# Patient Record
Sex: Male | Born: 1970 | Race: White | Hispanic: No | Marital: Married | Smoking: Never smoker
Health system: Southern US, Community
[De-identification: ages and names within clinical notes are randomized; demographics above are authoritative.]

## PROBLEM LIST (undated history)

## (undated) DIAGNOSIS — R7301 Impaired fasting glucose: Secondary | ICD-10-CM

## (undated) DIAGNOSIS — S43109A Unspecified dislocation of unspecified acromioclavicular joint, initial encounter: Secondary | ICD-10-CM

## (undated) DIAGNOSIS — E782 Mixed hyperlipidemia: Secondary | ICD-10-CM

## (undated) HISTORY — DX: Unspecified dislocation of unspecified acromioclavicular joint, initial encounter: S43.109A

## (undated) HISTORY — DX: Impaired fasting glucose: R73.01

## (undated) HISTORY — DX: Mixed hyperlipidemia: E78.2

---

## 2013-01-08 ENCOUNTER — Ambulatory Visit (INDEPENDENT_AMBULATORY_CARE_PROVIDER_SITE_OTHER): Payer: 59 | Admitting: Family Medicine

## 2013-01-08 ENCOUNTER — Encounter: Payer: Self-pay | Admitting: Family Medicine

## 2013-01-08 VITALS — BP 126/86 | HR 68 | Ht 72.0 in | Wt 192.0 lb

## 2013-01-08 DIAGNOSIS — Z23 Encounter for immunization: Secondary | ICD-10-CM

## 2013-01-08 DIAGNOSIS — E8881 Metabolic syndrome: Secondary | ICD-10-CM | POA: Insufficient documentation

## 2013-01-08 DIAGNOSIS — R7301 Impaired fasting glucose: Secondary | ICD-10-CM | POA: Insufficient documentation

## 2013-01-08 DIAGNOSIS — E782 Mixed hyperlipidemia: Secondary | ICD-10-CM | POA: Insufficient documentation

## 2013-01-08 DIAGNOSIS — Z0000 Encounter for general adult medical examination without abnormal findings: Secondary | ICD-10-CM

## 2013-01-08 LAB — POCT URINALYSIS DIPSTICK
Blood, UA: NEGATIVE
Nitrite, UA: NEGATIVE
Protein, UA: NEGATIVE
Spec Grav, UA: 1.01
Urobilinogen, UA: NEGATIVE
pH, UA: 7

## 2013-01-08 NOTE — Progress Notes (Signed)
Chief Complaint  Patient presents with  . Annual Exam    new patient nonfasting exam. No major concerns. Patient declines flu vaccine.    John Frazier is a 42 y.o. male who presents for a complete physical.  He has the following concerns:  He brings in copies of labs done through work: Labs 12/10/12: fglu 100; TG 286, Tchol 208, chol/HDL 72, HDL 29, LDL 122 (07/2011--fglu 106, TG 201, chol/hDL 7.8, HDL 29, LDL 156)  He cut back on juices about 2 months ago; stopped adding salt to his food about 3 months ago (but still has foods that contain a lot of sodium, ie chinese food).    He has no other complaints or concerns today.  Health Maintenance: There is no immunization history on file for this patient. Last tetanus was about 8 years ago, unsure if Td or Tdap, prior to his last job Declines flu shots Last colonoscopy: never Last PSA: never Dentist: about 2 years ago, previously had gone once yearly Ophtho: yearly Exercise:  Some exercise daily, more on weekends.  History reviewed. No pertinent past medical history.  History reviewed. No pertinent past surgical history.  History   Social History  . Marital Status: Single    Spouse Name: N/A    Number of Children: N/A  . Years of Education: N/A   Occupational History  . Account Manager [employer]   Social History Main Topics  . Smoking status: Never Smoker   . Smokeless tobacco: Never Used  . Alcohol Use: Yes     Comment: 0-2 drinks per week.  . Drug Use: No  . Sexual Activity: Yes    Partners: Female   Other Topics Concern  . Not on file   Social History Narrative   Engaged; lives with fiance, 3 cats.  No tobacco exposure.    Family History  Problem Relation Age of Onset  . Hypertension Mother   . Stroke Mother 68    shortly after CABG; multiple strokes since then  . Heart disease Mother 73    CABG x 2  . Diabetes Mother   . Hyperlipidemia Mother   . Seizures Brother   . Cancer Maternal Uncle    lung, smoker  . Cancer Maternal Grandmother     type not known  . Prostate cancer Neg Hx   . Cancer Maternal Uncle     ?type; abdominal  . Alcohol abuse Maternal Uncle     No current outpatient prescriptions on file.  No Known Allergies   ROS:  The patient denies anorexia, fever, weight changes, headaches,  vision loss, decreased hearing, ear pain, hoarseness, chest pain, palpitations, dizziness, syncope, dyspnea on exertion, cough, swelling, nausea, vomiting, diarrhea, constipation, abdominal pain, melena, hematochezia, indigestion/heartburn, hematuria, incontinence, erectile dysfunction, nocturia, weakened urine stream, dysuria, genital lesions, joint pains, numbness, tingling, weakness, tremor, suspicious skin lesions, depression, anxiety, abnormal bleeding/bruising, or enlarged lymph nodes  PHYSICAL EXAM:  BP 130/100  Pulse 68  Ht 6' (1.829 m)  Wt 192 lb (87.091 kg)  BMI 26.03 kg/m2 126/86 on repeat by MD General Appearance:    Alert, cooperative, no distress, appears stated age  Head:    Normocephalic, without obvious abnormality, atraumatic  Eyes:    PERRL, conjunctiva/corneas clear, EOM's intact, fundi    benign  Ears:    Normal TM's and external ear canals  Nose:   Nares normal, mucosa normal, no drainage or sinus   tenderness  Throat:   Lips, mucosa, and tongue normal  Neck:   Supple, no lymphadenopathy;  thyroid:  no   enlargement/tenderness/nodules; no carotid   bruit or JVD  Back:    Spine nontender, no curvature, ROM normal, no CVA     tenderness  Lungs:     Clear to auscultation bilaterally without wheezes, rales or     ronchi; respirations unlabored  Chest Wall:    No tenderness or deformity   Heart:    Regular rate and rhythm, S1 and S2 normal, no murmur, rub   or gallop  Breast Exam:    No chest wall tenderness, masses or gynecomastia  Abdomen:     Soft, non-tender, nondistended, normoactive bowel sounds,    no masses, no hepatosplenomegaly  Genitalia:     Normal male external genitalia without lesions.  Testicles without masses.  No inguinal hernias.  Rectal:    Exam declined/refused by patient.  Extremities:   No clubbing, cyanosis or edema  Pulses:   2+ and symmetric all extremities  Skin:   Skin color, texture, turgor normal, no rashes or lesions.  Benign moles noted on chest, back  Lymph nodes:   Cervical, supraclavicular, and axillary nodes normal  Neurologic:   CNII-XII intact, normal strength, sensation and gait; reflexes 2+ and symmetric throughout          Psych:   Normal mood, affect, hygiene and grooming.    ASSESSMENT/PLAN:  Routine general medical examination at a health care facility - Plan: Visual acuity screening, POCT Urinalysis Dipstick  Metabolic syndrome  Mixed dyslipidemia  Impaired fasting glucose  Need for Tdap vaccination - Plan: Tdap vaccine greater than or equal to 7yo IM  42 yo patient with mild IFG, elevated TG, very low HDL, normal LDL but high chol/HDL ratio, and mildly elevated diastolic BP; otherwise healthy.   Counseled extensively re: diet (low sodium, lowfat, low cholesterol), the need for daily exercise; recommended fish oil.  Re-check in 6 months (pt states he won't be able to return sooner than February).  Discussed low HDL--increasing exercise may help, but a lot is hereditary.  Even though LDL is okay, ratio is high; needs to get total cholesterol down.  Recommended at least 30 minutes of aerobic activity at least 5 days/week; proper sunscreen use reviewed; healthy diet and alcohol recommendations (less than or equal to 2 drinks/day) reviewed; regular seatbelt use; changing batteries in smoke detectors. Self-testicular exams. Immunization recommendations discussed--TdaP given; refused flu shot.   F/u 6 months with fasting labs prior

## 2013-01-08 NOTE — Patient Instructions (Addendum)
HEALTH MAINTENANCE RECOMMENDATIONS:  It is recommended that you get at least 30 minutes of aerobic exercise at least 5 days/week (for weight loss, you may need as much as 60-90 minutes). This can be any activity that gets your heart rate up. This can be divided in 10-15 minute intervals if needed, but try and build up your endurance at least once a week.  Weight bearing exercise is also recommended twice weekly.  Eat a healthy diet with lots of vegetables, fruits and fiber.  "Colorful" foods have a lot of vitamins (ie green vegetables, tomatoes, red peppers, etc).  Limit sweet tea, regular sodas and alcoholic beverages, all of which has a lot of calories and sugar.  Up to 2 alcoholic drinks daily may be beneficial for men (unless trying to lose weight, watch sugars).  Drink a lot of water.  Sunscreen of at least SPF 30 should be used on all sun-exposed parts of the skin when outside between the hours of 10 am and 4 pm (not just when at beach or pool, but even with exercise, golf, tennis, and yard work!)  Use a sunscreen that says "broad spectrum" so it covers both UVA and UVB rays, and make sure to reapply every 1-2 hours.  Remember to change the batteries in your smoke detectors when changing your clock times in the spring and fall.  Use your seat belt every time you are in a car, and please drive safely and not be distracted with cell phones and texting while driving.  Start omega-3 fish oil 3000-4000 mg daily to help with triglycerides.  Sodium-Controlled Diet Sodium is a mineral. It is found in many foods. Sodium may be found naturally or added during the making of a food. The most common form of sodium is salt, which is made up of sodium and chloride. Reducing your sodium intake involves changing your eating habits. The following guidelines will help you reduce the sodium in your diet:  Stop using the salt shaker.  Use salt sparingly in cooking and baking.  Substitute with sodium-free  seasonings and spices.  Do not use a salt substitute (potassium chloride) without your caregiver's permission.  Include a variety of fresh, unprocessed foods in your diet.  Limit the use of processed and convenience foods that are high in sodium. USE THE FOLLOWING FOODS SPARINGLY: Breads/Starches  Commercial bread stuffing, commercial pancake or waffle mixes, coating mixes. Waffles. Croutons. Prepared (boxed or frozen) potato, rice, or noodle mixes that contain salt or sodium. Salted [place] fries or hash browns. Salted popcorn, breads, crackers, chips, or snack foods. Vegetables  Vegetables canned with salt or prepared in cream, butter, or cheese sauces. Sauerkraut. Tomato or vegetable juices canned with salt.  Fresh vegetables are allowed if rinsed thoroughly. Fruit  Fruit is okay to eat. Meat and Meat Substitutes  Salted or smoked meats, such as bacon or Canadian bacon, chipped or corned beef, hot dogs, salt pork, luncheon meats, pastrami, ham, or sausage. Canned or smoked fish, poultry, or meat. Processed cheese or cheese spreads, blue or Roquefort cheese. Battered or frozen fish products. Prepared spaghetti sauce. Baked beans. Reuben sandwiches. Salted nuts. Caviar. Milk  Limit buttermilk to 1 cup per week. Soups and Combination Foods  Bouillon cubes, canned or dried soups, broth, consomm. Convenience (frozen or packaged) dinners with more than 600 mg sodium. Pot pies, pizza, Asian food, fast food cheeseburgers, and specialty sandwiches. Desserts and Sweets  Regular (salted) desserts, pie, commercial fruit snack pies, commercial snack cakes, canned puddings.  Eat desserts and sweets in moderation. Fats and Oils  Gravy mixes or canned gravy. No more than 1 to 2 tbs of salad dressing. Chip dips.  Eat fats and oils in moderation. Beverages  See those listed under the vegetables and milk groups. Condiments  Ketchup, mustard, meat sauces, salsa, regular (salted) and lite  soy sauce or mustard. Dill pickles, olives, meat tenderizer. Prepared horseradish or pickle relish. Dutch-processed cocoa. Baking powder or baking soda used medicinally. Worcestershire sauce. "Light" salt. Salt substitute, unless approved by your caregiver. Document Released: 10/20/2001 Document Revised: 07/23/2011 Document Reviewed: 05/23/2009 [facility] Patient Information 2014 [place], [place].  Fat and Cholesterol Control Diet Cholesterol levels in your body are determined significantly by your diet. Cholesterol levels may also be related to heart disease. The following material helps to explain this relationship and discusses what you can do to help keep your heart healthy. Not all cholesterol is bad. Low-density lipoprotein (LDL) cholesterol is the "bad" cholesterol. It may cause fatty deposits to build up inside your arteries. High-density lipoprotein (HDL) cholesterol is "good." It helps to remove the "bad" LDL cholesterol from your blood. Cholesterol is a very important risk factor for heart disease. Other risk factors are high blood pressure, smoking, stress, heredity, and weight. The heart muscle gets its supply of blood through the coronary arteries. If your LDL cholesterol is high and your HDL cholesterol is low, you are at risk for having fatty deposits build up in your coronary arteries. This leaves less room through which blood can flow. Without sufficient blood and oxygen, the heart muscle cannot function properly and you may feel chest pains (angina pectoris). When a coronary artery closes up entirely, a part of the heart muscle may die causing a heart attack (myocardial infarction). CHECKING CHOLESTEROL When your caregiver sends your blood to a lab to be examined for cholesterol, a complete lipid (fat) profile may be done. With this test, the total amount of cholesterol and levels of LDL and HDL are determined. Triglycerides are a type of fat that circulates in the blood. They can also be  used to determine heart disease risk. The list below describes what the numbers should be: Test: Total Cholesterol.  Less than 200 mg/dl. Test: LDL "bad cholesterol."  Less than 100 mg/dl.  Less than 70 mg/dl if you are at very high risk of a heart attack or sudden cardiac death. Test: HDL "good cholesterol."  Greater than 50 mg/dl for women.  Greater than 40 mg/dl for men. Test: Triglycerides.  Less than 150 mg/dl. CONTROLLING CHOLESTEROL WITH DIET Although exercise and lifestyle factors are important, your diet is key. That is because certain foods are known to raise cholesterol and others to lower it. The goal is to balance foods for their effect on cholesterol and more importantly, to replace saturated and trans fat with other types of fat, such as monounsaturated fat, polyunsaturated fat, and omega-3 fatty acids. On average, a person should consume no more than 15 to 17 g of saturated fat daily. Saturated and trans fats are considered "bad" fats, and they will raise LDL cholesterol. Saturated fats are primarily found in animal products such as meats, butter, and cream. However, that does not mean you need to give up all your favorite foods. Today, there are good tasting, low-fat, low-cholesterol substitutes for most of the things you like to eat. Choose low-fat or nonfat alternatives. Choose round or loin cuts of red meat. These types of cuts are lowest in fat and cholesterol. Chicken (  without the skin), fish, veal, and ground [place] breast are great choices. Eliminate fatty meats, such as hot dogs and salami. Even shellfish have little or no saturated fat. Have a 3 oz (85 g) portion when you eat lean meat, poultry, or fish. Trans fats are also called "partially hydrogenated oils." They are oils that have been scientifically manipulated so that they are solid at room temperature resulting in a longer shelf life and improved taste and texture of foods in which they are added. Trans fats are  found in stick margarine, some tub margarines, cookies, crackers, and baked goods.  When baking and cooking, oils are a great substitute for butter. The monounsaturated oils are especially beneficial since it is believed they lower LDL and raise HDL. The oils you should avoid entirely are saturated tropical oils, such as coconut and palm.  Remember to eat a lot from food groups that are naturally free of saturated and trans fat, including fish, fruit, vegetables, beans, grains (barley, rice, couscous, bulgur wheat), and pasta (without cream sauces).  IDENTIFYING FOODS THAT LOWER CHOLESTEROL  Soluble fiber may lower your cholesterol. This type of fiber is found in fruits such as apples, vegetables such as broccoli, potatoes, and carrots, legumes such as beans, peas, and lentils, and grains such as barley. Foods fortified with plant sterols (phytosterol) may also lower cholesterol. You should eat at least 2 g per day of these foods for a cholesterol lowering effect.  Read package labels to identify low-saturated fats, trans fat free, and low-fat foods at the supermarket. Select cheeses that have only 2 to 3 g saturated fat per ounce. Use a heart-healthy tub margarine that is free of trans fats or partially hydrogenated oil. When buying baked goods (cookies, crackers), avoid partially hydrogenated oils. Breads and muffins should be made from whole grains (whole-wheat or whole oat flour, instead of "flour" or "enriched flour"). Buy non-creamy canned soups with reduced salt and no added fats.  FOOD PREPARATION TECHNIQUES  Never deep-fry. If you must fry, either stir-fry, which uses very little fat, or use non-stick cooking sprays. When possible, broil, bake, or roast meats, and steam vegetables. Instead of putting butter or margarine on vegetables, use lemon and herbs, applesauce, and cinnamon (for squash and sweet potatoes), nonfat yogurt, salsa, and low-fat dressings for salads.  LOW-SATURATED FAT / LOW-FAT  FOOD SUBSTITUTES Meats / Saturated Fat (g)  Avoid: Steak, marbled (3 oz/85 g) / 11 g  Choose: Steak, lean (3 oz/85 g) / 4 g  Avoid: Hamburger (3 oz/85 g) / 7 g  Choose: Hamburger, lean (3 oz/85 g) / 5 g  Avoid: Ham (3 oz/85 g) / 6 g  Choose: Ham, lean cut (3 oz/85 g) / 2.4 g  Avoid: Chicken, with skin, dark meat (3 oz/85 g) / 4 g  Choose: Chicken, skin removed, dark meat (3 oz/85 g) / 2 g  Avoid: Chicken, with skin, light meat (3 oz/85 g) / 2.5 g  Choose: Chicken, skin removed, light meat (3 oz/85 g) / 1 g Dairy / Saturated Fat (g)  Avoid: Whole milk (1 cup) / 5 g  Choose: Low-fat milk, 2% (1 cup) / 3 g  Choose: Low-fat milk, 1% (1 cup) / 1.5 g  Choose: Skim milk (1 cup) / 0.3 g  Avoid: Hard cheese (1 oz/28 g) / 6 g  Choose: Skim milk cheese (1 oz/28 g) / 2 to 3 g  Avoid: Cottage cheese, 4% fat (1 cup) / 6.5 g  Choose: Low-fat  cottage cheese, 1% fat (1 cup) / 1.5 g  Avoid: Ice cream (1 cup) / 9 g  Choose: Sherbet (1 cup) / 2.5 g  Choose: Nonfat frozen yogurt (1 cup) / 0.3 g  Choose: Frozen fruit bar / trace  Avoid: Whipped cream (1 tbs) / 3.5 g  Choose: Nondairy whipped topping (1 tbs) / 1 g Condiments / Saturated Fat (g)  Avoid: Mayonnaise (1 tbs) / 2 g  Choose: Low-fat mayonnaise (1 tbs) / 1 g  Avoid: Butter (1 tbs) / 7 g  Choose: Extra light margarine (1 tbs) / 1 g  Avoid: Coconut oil (1 tbs) / 11.8 g  Choose: Olive oil (1 tbs) / 1.8 g  Choose: Corn oil (1 tbs) / 1.7 g  Choose: Safflower oil (1 tbs) / 1.2 g  Choose: Sunflower oil (1 tbs) / 1.4 g  Choose: Soybean oil (1 tbs) / 2.4 g  Choose: Canola oil (1 tbs) / 1 g Document Released: 04/30/2005 Document Revised: 07/23/2011 Document Reviewed: 10/19/2010 [employer] Patient Information 2014 [place], [place].  Metabolic Syndrome, Adult Metabolic syndrome descibes a group of risk factors for heart disease and diabetes. This syndrome has other names including Insulin Resistance Syndrome. The  more risk factors you have, the higher your risk of having a heart attack, stroke, or developing diabetes. These risk factors include:  High blood sugar.  High blood triglyceride (a fat found in the blood) level.  High blood pressure.  Abdominal obesity (your extra weight is around your waist instead of your hips).  Low levels of high-density lipoprotein, HDL (good blood cholesterol). If you have any three of these risk factors, you have metabolic syndrome. If you have even one of these factors, you should make lifestyle changes to improve your health in order to prevent serious health diseases.  In people with metabolic syndrome, the cells do not respond properly to insulin. This can lead to high levels of glucose in the blood, which can interfere with normal body processes. Eventually, this can cause high blood pressure and higher fat levels in the blood, and inflammation of your blood vessels. The result can be heart disease and stroke.  CAUSES   Eating a diet rich in calories and saturated fat.  Too little physical activity.  Being overweight. Other underlying causes are:  Family history (genetics).  Ethnicity (South Asians are at a higher risk).  Older age (your chances of developing metabolic syndrome are higher as you grow older).  Insulin resistance. SYMPTOMS  By itself, metabolic syndrome has no symptoms. However, you might have symptoms of diabetes (high blood sugar) or high blood pressure, such as:  Increased thirst, urination, and tiredness.  Dizzy spells.  Dull headaches that are unusual for you.  Blurred vision.  Nosebleeds. DIAGNOSIS  Your caregiver may make a diagnosis of metabolic syndrome if you have at least three of these factors:  If you are overweight mostly around the waist. This means a waistline greater than 40" in men and more than 35" in women. The waistline limits are 31 to 35 inches for women and 37 to 39 inches for men. In those who have  certain genetic risk factors, such as having a family history of diabetes or being of Asian descent.  If you have a blood pressure of 130/85 mm Hg or more, or if you are being treated for high blood pressure.  If your blood triglyceride level is 150 mg/dL or more, or you are being treated for high levels of triglyceride.  If the level of HDL in your blood is below 40 mg/dL in men, less than 50 mg/dL in women, or you are receiving treatment for low levels of HDL.  If the level of sugar in your blood is high with fasting blood sugar level of 110 mg/dL or more, or you are under treatment for diabetes. TREATMENT  Your caregiver may have you make lifestyle changes, which may include:  Exercise.  Losing weight.  Maintaining a healthy diet.  Quitting smoking. The lifestyle changes listed above are key in reducing your risk for heart disease and stroke. Medicines may also be prescribed to help your body respond to insulin better and to reduce your blood pressure and blood fat levels. Aspirin may be recommended to reduce risks of heart disease or stroke.  HOME CARE INSTRUCTIONS   Exercise.  Measure your waist at regular intervals just above the hipbones after you have breathed out.  Maintain a healthy diet.  Eat fruits, such as apples, oranges, and pears.  Eat vegetables.  Eat legumes, such as kidney beans, peas, and lentils.  Eat food rich in soluble fiber, such as whole grain cereal, oatmeal, and oat bran.  Use olive or safflower oils and avoid saturated fats.  Eat nuts.  Limit the amount of salt you eat or add to food.  Limit the amount of alcohol you drink.  Include fish in your diet, if possible.  Stop smoking if you are a smoker.  Maintain regular follow-up appointments.  Follow your caregiver's advice. SEEK MEDICAL CARE IF:   You feel very tired or fatigued.  You develop excessive thirst.  You pass large quantities of urine.  You are putting on weight around  your waist rather than losing weight.  You develop headaches over and over again.  You have off-and-on dizzy spells. SEEK IMMEDIATE MEDICAL CARE IF:   You develop nosebleeds.  You develop sudden blurred vision.  You develop sudden dizzy spells.  You develop chest pains, trouble breathing, or feel an abnormal or irregular heart beat.  You have a fainting episode.  You develop any sudden trouble speaking and/or swallowing.  You develop sudden weakness in one arm and/or one leg. MAKE SURE YOU:   Understand these instructions.  Will watch your condition.  Will get help right away if you are not doing well or get worse. Document Released: 08/07/2007 Document Revised: 07/23/2011 Document Reviewed: 08/07/2007 [facility] Patient Information 2014 [place], [place].

## 2014-01-13 ENCOUNTER — Encounter: Payer: Self-pay | Admitting: Family Medicine

## 2014-10-07 ENCOUNTER — Telehealth: Payer: Self-pay | Admitting: Internal Medicine

## 2014-10-07 NOTE — Telephone Encounter (Signed)
Pt has not been seen since 2014 with you but called today needing a referral to chiropactor, has he seen Alvan DamePaul Spell yesterday for lower back pain. Pt has [employer]Marland Kitchen. Pt was told you were not here today but will be here tomorrow and if you approved this(since he hasn't been in), it will be done tomorrow by her nurse Suzette BattiestVeronica, he said ok.   [employer]- [id number] Provider-Paul Spell @ [facility] on [address] Dx- Lower back pain

## 2014-10-07 NOTE — Telephone Encounter (Signed)
Tried to call pt but phone number is not correct.

## 2014-10-07 NOTE — Telephone Encounter (Signed)
He has been seen once in the office almost 2 years ago, (and was told to f/u in 6 months).  If his insurance requires a referral from PCP, then they shouldn't have seen him without checking with our office first. I cannot retroactively approve this   (I only retroactively approve when pts have a change in insurance, and are going for routine f/u visits with other providers that have been giving routine care--ie cardiologist, eye doctor, etc, but due to change in insurance, needs a retroactive approval, and see me on a routine basis).

## 2015-11-14 ENCOUNTER — Encounter: Payer: Self-pay | Admitting: Family Medicine

## 2015-11-14 ENCOUNTER — Ambulatory Visit (INDEPENDENT_AMBULATORY_CARE_PROVIDER_SITE_OTHER): Payer: 59 | Admitting: Family Medicine

## 2015-11-14 VITALS — BP 110/80 | HR 88 | Ht 72.5 in | Wt 200.0 lb

## 2015-11-14 DIAGNOSIS — E559 Vitamin D deficiency, unspecified: Secondary | ICD-10-CM

## 2015-11-14 DIAGNOSIS — K5900 Constipation, unspecified: Secondary | ICD-10-CM | POA: Diagnosis not present

## 2015-11-14 DIAGNOSIS — R5383 Other fatigue: Secondary | ICD-10-CM

## 2015-11-14 DIAGNOSIS — E8881 Metabolic syndrome: Secondary | ICD-10-CM

## 2015-11-14 DIAGNOSIS — R7301 Impaired fasting glucose: Secondary | ICD-10-CM | POA: Diagnosis not present

## 2015-11-14 DIAGNOSIS — Z0000 Encounter for general adult medical examination without abnormal findings: Secondary | ICD-10-CM

## 2015-11-14 DIAGNOSIS — E782 Mixed hyperlipidemia: Secondary | ICD-10-CM | POA: Diagnosis not present

## 2015-11-14 LAB — CBC WITH DIFFERENTIAL/PLATELET
BASOS ABS: 0 {cells}/uL (ref 0–200)
BASOS PCT: 0 %
EOS PCT: 1 %
Eosinophils Absolute: 66 cells/uL (ref 15–500)
HCT: 44.7 % (ref 38.5–50.0)
Hemoglobin: 15.7 g/dL (ref 13.2–17.1)
LYMPHS ABS: 1980 {cells}/uL (ref 850–3900)
Lymphocytes Relative: 30 %
MCH: 30.6 pg (ref 27.0–33.0)
MCHC: 35.1 g/dL (ref 32.0–36.0)
MCV: 87.1 fL (ref 80.0–100.0)
MONOS PCT: 6 %
MPV: 10.1 fL (ref 7.5–12.5)
Monocytes Absolute: 396 cells/uL (ref 200–950)
NEUTROS ABS: 4158 {cells}/uL (ref 1500–7800)
Neutrophils Relative %: 63 %
PLATELETS: 213 10*3/uL (ref 140–400)
RBC: 5.13 MIL/uL (ref 4.20–5.80)
RDW: 13.1 % (ref 11.0–15.0)
WBC: 6.6 10*3/uL (ref 4.0–10.5)

## 2015-11-14 LAB — COMPREHENSIVE METABOLIC PANEL
ALBUMIN: 4.7 g/dL (ref 3.6–5.1)
ALT: 38 U/L (ref 9–46)
AST: 17 U/L (ref 10–40)
Alkaline Phosphatase: 90 U/L (ref 40–115)
BILIRUBIN TOTAL: 0.5 mg/dL (ref 0.2–1.2)
BUN: 11 mg/dL (ref 7–25)
CHLORIDE: 103 mmol/L (ref 98–110)
CO2: 25 mmol/L (ref 20–31)
CREATININE: 0.88 mg/dL (ref 0.60–1.35)
Calcium: 9.4 mg/dL (ref 8.6–10.3)
GLUCOSE: 105 mg/dL — AB (ref 65–99)
Potassium: 4.2 mmol/L (ref 3.5–5.3)
SODIUM: 140 mmol/L (ref 135–146)
Total Protein: 7.4 g/dL (ref 6.1–8.1)

## 2015-11-14 LAB — HEMOGLOBIN A1C
Hgb A1c MFr Bld: 5.5 % (ref <5.7)
Mean Plasma Glucose: 111 mg/dL

## 2015-11-14 LAB — LIPID PANEL
Cholesterol: 220 mg/dL — ABNORMAL HIGH (ref 125–200)
HDL: 27 mg/dL — ABNORMAL LOW (ref >=40)
LDL CALC: 139 mg/dL — AB (ref <130)
Total CHOL/HDL Ratio: 8.1 Ratio — ABNORMAL HIGH (ref <=5.0)
Triglycerides: 270 mg/dL — ABNORMAL HIGH (ref <150)
VLDL: 54 mg/dL — AB (ref <30)

## 2015-11-14 LAB — TSH: TSH: 1.31 mIU/L (ref 0.40–4.50)

## 2015-11-14 NOTE — Patient Instructions (Addendum)
HEALTH MAINTENANCE RECOMMENDATIONS:  It is recommended that you get at least 30 minutes of aerobic exercise at least 5 days/week (for weight loss, you may need as much as 60-90 minutes). This can be any activity that gets your heart rate up. This can be divided in 10-15 minute intervals if needed, but try and build up your endurance at least once a week.  Weight bearing exercise is also recommended twice weekly.  Eat a healthy diet with lots of vegetables, fruits and fiber.  "Colorful" foods have a lot of vitamins (ie green vegetables, tomatoes, red peppers, etc).  Limit sweet tea, regular sodas and alcoholic beverages, all of which has a lot of calories and sugar.  Up to 2 alcoholic drinks daily may be beneficial for men (unless trying to lose weight, watch sugars).  Drink a lot of water.  Sunscreen of at least SPF 30 should be used on all sun-exposed parts of the skin when outside between the hours of 10 am and 4 pm (not just when at beach or pool, but even with exercise, golf, tennis, and yard work!)  Use a sunscreen that says "broad spectrum" so it covers both UVA and UVB rays, and make sure to reapply every 1-2 hours.  Remember to change the batteries in your smoke detectors when changing your clock times in the spring and fall.  Use your seat belt every time you are in a car, and please drive safely and not be distracted with cell phones and texting while driving.  Food Choices to Lower Your Triglycerides Triglycerides are a type of fat in your blood. High levels of triglycerides can increase the risk of heart disease and stroke. If your triglyceride levels are high, the foods you eat and your eating habits are very important. Choosing the right foods can help lower your triglycerides.  WHAT GENERAL GUIDELINES DO I NEED TO FOLLOW?  Lose weight if you are overweight.   Limit or avoid alcohol.   Fill one half of your plate with vegetables and green salads.   Limit fruit to two servings  a day. Choose fruit instead of juice.   Make one fourth of your plate whole grains. Look for the word "whole" as the first word in the ingredient list.  Fill one fourth of your plate with lean protein foods.  Enjoy fatty fish (such as salmon, mackerel, sardines, and tuna) three times a week.   Choose healthy fats.   Limit foods high in starch and sugar.  Eat more home-cooked food and less restaurant, buffet, and fast food.  Limit fried foods.  Cook foods using methods other than frying.  Limit saturated fats.  Check ingredient lists to avoid foods with partially hydrogenated oils (trans fats) in them. WHAT FOODS CAN I EAT?  Grains Whole grains, such as whole wheat or whole grain breads, crackers, cereals, and pasta. Unsweetened oatmeal, bulgur, barley, quinoa, or brown rice. Corn or whole wheat flour tortillas.  Vegetables Fresh or frozen vegetables (raw, steamed, roasted, or grilled). Green salads. Fruits All fresh, canned (in natural juice), or frozen fruits. Meat and Other Protein Products Ground beef (85% or leaner), grass-fed beef, or beef trimmed of fat. Skinless chicken or [place]. Ground chicken or [place]. Pork trimmed of fat. All fish and seafood. Eggs. Dried beans, peas, or lentils. Unsalted nuts or seeds. Unsalted canned or dry beans. Dairy Low-fat dairy products, such as skim or 1% milk, 2% or reduced-fat cheeses, low-fat ricotta or cottage cheese, or plain low-fat yogurt.  Fats and Oils Tub margarines without trans fats. Light or reduced-fat mayonnaise and salad dressings. Avocado. Safflower, olive, or canola oils. Natural peanut or almond butter. The items listed above may not be a complete list of recommended foods or beverages. Contact your dietitian for more options. WHAT FOODS ARE NOT RECOMMENDED?  Grains White bread. White pasta. White rice. Cornbread. Bagels, pastries, and croissants. Crackers that contain trans fat. Vegetables White potatoes. Corn. Creamed  or fried vegetables. Vegetables in a cheese sauce. Fruits Dried fruits. Canned fruit in light or heavy syrup. Fruit juice. Meat and Other Protein Products Fatty cuts of meat. Ribs, chicken wings, bacon, sausage, bologna, salami, chitterlings, fatback, hot dogs, bratwurst, and packaged luncheon meats. Dairy Whole or 2% milk, cream, half-and-half, and cream cheese. Whole-fat or sweetened yogurt. Full-fat cheeses. Nondairy creamers and whipped toppings. Processed cheese, cheese spreads, or cheese curds. Sweets and Desserts Corn syrup, sugars, honey, and molasses. Candy. Jam and jelly. Syrup. Sweetened cereals. Cookies, pies, cakes, donuts, muffins, and ice cream. Fats and Oils Butter, stick margarine, lard, shortening, ghee, or bacon fat. Coconut, palm kernel, or palm oils. Beverages Alcohol. Sweetened drinks (such as sodas, lemonade, and fruit drinks or punches). The items listed above may not be a complete list of foods and beverages to avoid. Contact your dietitian for more information.   This information is not intended to replace advice given to you by your health care provider. Make sure you discuss any questions you have with your health care provider.   Document Released: 02/16/2004 Document Revised: 05/21/2014 Document Reviewed: 03/04/2013 Elsevier Interactive Patient Education [employer]2016 Elsevier Inc.

## 2015-11-14 NOTE — Progress Notes (Signed)
Chief Complaint  Patient presents with  . Annual Exam    sees eye doctor, retired is searching for a new one. stress at work. no other doctors. no problems or concerns   John Frazier is a 45 y.o. male who presents for a complete physical.  He has the following concerns:  He had labs through work in October or November--didn't bring in copies. He is fasting to have labs done today. He reports that "lots of things were a little out of range"--blood pressure, sugar, TG was in the 500's and reports he was fasting. He also states the LDL was high and HDL was low.  He hadn't been exercising, just recently started (about a month ago).  +snacks on chips, crackers, doesn't eat much sweets.  Doesn't eat much red meat, occasional burger.  Girlfriend is on Weight Watchers, and when she is doing well on her diet, he eats better also.  Immunization History  Administered Date(s) Administered  . Tdap 01/08/2013   Declines flu shots Last colonoscopy: never Last PSA: never Dentist: regular (had been going every 3 months for deep cleanings, now tissue is healthy/resolved.  Back to q6 months.) Ophtho: yearly Exercise: Over the last month or so he has been walking 30 minutes/d before work on the treadmill over the last month (none in the last week). No exercise on the weekends.  History reviewed. No pertinent past medical history.  History reviewed. No pertinent past surgical history.  Social History   Social History  . Marital Status: Single    Spouse Name: N/A  . Number of Children: N/A  . Years of Education: N/A   Occupational History  . Account Manager [employer]   Social History Main Topics  . Smoking status: Never Smoker   . Smokeless tobacco: Never Used  . Alcohol Use: Yes     Comment: 0-2 drinks per week.  . Drug Use: No  . Sexual Activity:    Partners: Female   Other Topics Concern  . Not on file   Social History Narrative   Engaged; lives with fiance (long-term committed  relationship--no set date or plan to marry), 2 cats.  No tobacco exposure. Fostering a dog currently.    Family History  Problem Relation Age of Onset  . Hypertension Mother   . Stroke Mother [id number]    shortly after CABG; multiple strokes since then  . Heart disease Mother [id number]    CABG x 2  . Diabetes Mother   . Hyperlipidemia Mother   . Seizures Brother   . Diabetes Brother   . Cancer Maternal Uncle     lung, smoker  . Cancer Maternal Grandmother     type not known  . Prostate cancer Neg Hx   . Cancer Maternal Uncle     ?type; abdominal  . Alcohol abuse Maternal Uncle     No outpatient encounter prescriptions on file as of 11/14/2015.   No facility-administered encounter medications on file as of 11/14/2015.    No Known Allergies   ROS: The patient denies anorexia, fever, weight changes (he has gained 8# since his last physical here in [id number], but denies recent weight gain), headaches, vision loss, decreased hearing, ear pain, hoarseness, chest pain, palpitations, dizziness, syncope, dyspnea on exertion, cough, swelling, nausea, vomiting, diarrhea, abdominal pain, melena, indigestion/heartburn, hematuria, incontinence, erectile dysfunction, nocturia, weakened urine stream, dysuria, genital lesions, joint pains, numbness, tingling, weakness, tremor, suspicious skin lesions, depression, anxiety, abnormal bleeding/bruising, or enlarged lymph nodes Some BRB after  a hard stool, not painful.  Infrequent.    PHYSICAL EXAM:  BP 110/80 mmHg  Pulse 88  Ht 6' 0.5" (1.842 m)  Wt 200 lb (90.719 kg)  BMI 26.74 kg/m2  General Appearance:   Alert, cooperative, no distress, appears stated age  Head:   Normocephalic, without obvious abnormality, atraumatic  Eyes:   PERRL, conjunctiva/corneas clear, EOM's intact, fundi   benign  Ears:   Normal TM's and external ear canals  Nose:  Nares normal, mucosa normal, no drainage or sinus tenderness  Throat:  Lips, mucosa, and  tongue normal  Neck:  Supple, no lymphadenopathy; thyroid: no enlargement/tenderness/nodules; no carotid  bruit or JVD  Back:  Spine nontender, no curvature, ROM normal, no CVA tenderness  Lungs:   Clear to auscultation bilaterally without wheezes, rales or ronchi; respirations unlabored  Chest Wall:   No tenderness or deformity  Heart:   Regular rate and rhythm, S1 and S2 normal, no murmur, rub  or gallop  Breast Exam:   No chest wall tenderness, masses or gynecomastia  Abdomen:   Soft, non-tender, nondistended, normoactive bowel sounds,   no masses, no hepatosplenomegaly  Genitalia:   Normal male external genitalia without lesions. Testicles without masses. No inguinal hernias.  Rectal:   Normal sphincter tone.  No masses, external hemorrhoids, palpable masses.  Heme negative light brown stool  Extremities:  No clubbing, cyanosis or edema  Pulses:  2+ and symmetric all extremities  Skin:  Skin color, texture, turgor normal, no rashes or lesions. Benign moles noted on chest, back  Lymph nodes:  Cervical, supraclavicular, and axillary nodes normal  Neurologic:  CNII-XII intact, normal strength, sensation and gait; reflexes 2+ and symmetric throughout   Psych: Normal mood, affect, hygiene and grooming.       ASSESSMENT/PLAN:  Annual physical exam - Plan: Comprehensive metabolic panel, Lipid panel, CBC with Differential/Platelet, VITAMIN D 25 Hydroxy (Vit-D Deficiency, Fractures), TSH, Hemoglobin A1c  Mixed dyslipidemia - reviewed lowfat, low cholesterol diet. Doesn't tolerate fish oil - Plan: Lipid panel  Impaired fasting glucose - previously noted on work labs (normal on last check here); to bring copies of other labs from work - Plan: Comprehensive metabolic panel, Hemoglobin A1c  Other fatigue - Plan: Comprehensive metabolic panel, CBC with Differential/Platelet,  TSH  Metabolic syndrome  Constipation, unspecified constipation type - with intermittent BRB.  Discussed high fiber diet, fluids, stool softeners. f/u if persistent blood, abdominal pain   Counseled extensively re: diet (lowfat, low cholesterol).  Fish oil had been recommended in the past, can't tolerate  Recommended at least 30 minutes of aerobic activity at least 5 days/week, weight-bearing exercise at least 2x/wk; proper sunscreen use reviewed; healthy diet and alcohol recommendations (less than or equal to 2 drinks/day) reviewed; regular seatbelt use; changing batteries in smoke detectors. Self-testicular exams. Immunization recommendations discussed--yearly flu shots recommended; colonoscopy age 45, sooner prn   F/u 1 year---sooner prn, based on lab results

## 2015-11-15 DIAGNOSIS — E559 Vitamin D deficiency, unspecified: Secondary | ICD-10-CM | POA: Insufficient documentation

## 2015-11-15 LAB — VITAMIN D 25 HYDROXY (VIT D DEFICIENCY, FRACTURES): Vit D, 25-Hydroxy: 20 ng/mL — ABNORMAL LOW (ref 30–100)

## 2015-11-15 MED ORDER — NIACIN ER (ANTIHYPERLIPIDEMIC) 500 MG PO TBCR: EXTENDED_RELEASE_TABLET | ORAL | Status: DC

## 2015-11-15 MED ORDER — VITAMIN D (ERGOCALCIFEROL) 1.25 MG (50000 UNIT) PO CAPS: 50000.0000 [IU] | ORAL_CAPSULE | ORAL | Status: DC

## 2015-11-15 NOTE — Addendum Note (Signed)
Addended byJoselyn Arrow: Kima Malenfant on: 11/15/2015 11:27 AM   Modules accepted: Orders

## 2015-11-16 ENCOUNTER — Other Ambulatory Visit: Payer: Self-pay | Admitting: *Deleted

## 2015-11-16 DIAGNOSIS — E782 Mixed hyperlipidemia: Secondary | ICD-10-CM

## 2015-11-16 DIAGNOSIS — Z79899 Other long term (current) drug therapy: Secondary | ICD-10-CM

## 2015-11-17 ENCOUNTER — Encounter: Payer: Self-pay | Admitting: Family Medicine

## 2015-12-27 ENCOUNTER — Other Ambulatory Visit: Payer: 59

## 2015-12-27 DIAGNOSIS — E782 Mixed hyperlipidemia: Secondary | ICD-10-CM

## 2015-12-27 DIAGNOSIS — Z79899 Other long term (current) drug therapy: Secondary | ICD-10-CM

## 2015-12-27 LAB — LIPID PANEL
CHOL/HDL RATIO: 6.5 ratio — AB (ref <=5.0)
CHOLESTEROL: 202 mg/dL — AB (ref 125–200)
HDL: 31 mg/dL — AB (ref >=40)
LDL Cholesterol: 118 mg/dL (ref <130)
TRIGLYCERIDES: 264 mg/dL — AB (ref <150)
VLDL: 53 mg/dL — ABNORMAL HIGH (ref <30)

## 2015-12-27 LAB — HEPATIC FUNCTION PANEL
ALBUMIN: 4.5 g/dL (ref 3.6–5.1)
ALK PHOS: 85 U/L (ref 40–115)
ALT: 43 U/L (ref 9–46)
AST: 19 U/L (ref 10–40)
Bilirubin, Direct: 0.1 mg/dL (ref <=0.2)
Indirect Bilirubin: 0.5 mg/dL (ref 0.2–1.2)
TOTAL PROTEIN: 6.7 g/dL (ref 6.1–8.1)
Total Bilirubin: 0.6 mg/dL (ref 0.2–1.2)

## 2016-01-02 ENCOUNTER — Other Ambulatory Visit: Payer: Self-pay | Admitting: *Deleted

## 2016-01-02 MED ORDER — FENOFIBRATE 48 MG PO TABS: 48.0000 mg | ORAL_TABLET | Freq: Every day | ORAL | 1 refills | Status: DC

## 2016-02-12 NOTE — Progress Notes (Signed)
Chief Complaint  Patient presents with  . Follow-up    6-8 week fasting follow on cholesterol-taking OTC niacin 500mg  and 48mg  Tricor generic.   Marland Kitchen. Flu Vaccine    declined.     Patient presents to follow up on his cholesterol.  He has dyslipidemia--had mildly elevated LDL, high TG and low HDL.  He was started on Niacin in July, with only very slight improvement noted (TG remained elevated, HDL improved, but still low).  He had been only on 500mg  of an OTC niacin.  It was suggested to increase to 1000mg  and switch to prescription, but this was very expensive.  We also discussed Vascepa and Lovaza which also would not be affordable under the plan he had (high deductible, including for medications).  Instead, we added generic low dose fenofibrate once daily in August, and had him continue the OTC Niacin. He hasn't found a fish oil that he likes yet, not taking any regularly. He is fasting for recheck of his labs today.  He is tolerating the fenofibrate without side effects.  He is trying to follow a lowfat diet. He cut back on sweets, but admits that chocolate is still his weak point. Switched to trail mix rather than straight chocolate.  (labs prior to adding fenofibrate:) Lab Results  Component Value Date   CHOL 202 (H) 12/27/2015   HDL 31 (L) 12/27/2015   LDLCALC 118 12/27/2015   TRIG 264 (H) 12/27/2015   CHOLHDL 6.5 (H) 12/27/2015    Impaired fasting glucose--105 in July, with a normal A1c (5.5).  Vitamin D deficiency--level was low at 20 in July.  He was prescribed a 12 week course of 50K units, and was advised to start 2000 IU once the Rx was completed. He has 3 pills left.  PMH, PSH, SH reviewed  Outpatient Encounter Prescriptions as of 02/13/2016  Medication Sig  . fenofibrate (TRICOR) 48 MG tablet Take 1 tablet (48 mg total) by mouth daily.  . niacin 500 MG tablet Take 500 mg by mouth at bedtime.  . Vitamin D, Ergocalciferol, (DRISDOL) 50000 units CAPS capsule Take 1 capsule (50,000  Units total) by mouth every 7 (seven) days.  . [DISCONTINUED] niacin (NIASPAN) 500 MG CR tablet Take 1 tablet by mouth every evening with lowfat snack; take aspirin 30-60 mins beforehand   No facility-administered encounter medications on file as of 02/13/2016.    No Known Allergies  ROS: no fever, chills, headaches, URI symptoms, shortness of breath, GI or GU complaints, bleeding, bruising, rash or other concerns.  PHYSICAL EXAM:  BP 130/86 (BP Location: Right Arm, Patient Position: Sitting, Cuff Size: Normal)   Pulse 72   Ht 6' 0.05" (1.83 m)   Wt 205 lb 9.6 oz (93.3 kg)   BMI 27.85 kg/m   Well developed, well nourished, pleasant male in no distress HEENT: conjunctiva and sclera are clear, OP clear Neck: no lymphadenopathy, thyromegaly or mass Heart: regular rate and rhythm Lungs: clear bilaterally Abdomen: soft, nontender, no organomegaly or mass Extremities: no edema Skin: normal turgor, no rash Psych: normal mood, affect, hygiene and grooming Neuro: alert and oriented, cranial nerves intact, normal gait  ASSESSMENT/PLAN:  Mixed dyslipidemia - recheck today; may need to change to higher dose Rx niaspan in the future when has better insurance coverage. Diet reviewed, exercise - Plan: Lipid panel, Hepatic function panel  Impaired fasting glucose - reviewed diet, exercise - Plan: Glucose, random  Metabolic syndrome - Plan: Lipid panel, Glucose, random  Vitamin D deficiency -  complete rx course; discussed need for long-term daily supplement (but if still very low, may extend rx course) - Plan: VITAMIN D 25 Hydroxy (Vit-D Deficiency, Fractures)  Medication monitoring encounter - Plan: Hepatic function panel, VITAMIN D 25 Hydroxy (Vit-D Deficiency, Fractures)  Lipids, LFT, glu, D   Finish out the prescription weekly vitamin D that you have.  If your levels are okay based on labs today, then switch over to the over-the-counter daily vitamin D3 of 2000 IU every day.  Declines  flu shot--encouraged due to partner on immunosuppressant medications.   F/u based on labs  Discussed preference for higher niacin in future when insurance changes

## 2016-02-13 ENCOUNTER — Encounter: Payer: Self-pay | Admitting: Family Medicine

## 2016-02-13 ENCOUNTER — Ambulatory Visit (INDEPENDENT_AMBULATORY_CARE_PROVIDER_SITE_OTHER): Payer: 59 | Admitting: Family Medicine

## 2016-02-13 VITALS — BP 130/86 | HR 72 | Ht 72.05 in | Wt 205.6 lb

## 2016-02-13 DIAGNOSIS — E8881 Metabolic syndrome: Secondary | ICD-10-CM | POA: Diagnosis not present

## 2016-02-13 DIAGNOSIS — E559 Vitamin D deficiency, unspecified: Secondary | ICD-10-CM | POA: Diagnosis not present

## 2016-02-13 DIAGNOSIS — R7301 Impaired fasting glucose: Secondary | ICD-10-CM

## 2016-02-13 DIAGNOSIS — Z5181 Encounter for therapeutic drug level monitoring: Secondary | ICD-10-CM

## 2016-02-13 DIAGNOSIS — E782 Mixed hyperlipidemia: Secondary | ICD-10-CM

## 2016-02-13 LAB — HEPATIC FUNCTION PANEL
ALBUMIN: 4.8 g/dL (ref 3.6–5.1)
ALT: 37 U/L (ref 9–46)
AST: 19 U/L (ref 10–40)
Alkaline Phosphatase: 73 U/L (ref 40–115)
BILIRUBIN TOTAL: 0.5 mg/dL (ref 0.2–1.2)
Bilirubin, Direct: 0.1 mg/dL (ref <=0.2)
Indirect Bilirubin: 0.4 mg/dL (ref 0.2–1.2)
TOTAL PROTEIN: 7.2 g/dL (ref 6.1–8.1)

## 2016-02-13 LAB — LIPID PANEL
CHOL/HDL RATIO: 7.7 ratio — AB (ref <=5.0)
CHOLESTEROL: 224 mg/dL — AB (ref 125–200)
HDL: 29 mg/dL — AB (ref >=40)
LDL Cholesterol: 156 mg/dL — ABNORMAL HIGH (ref <130)
TRIGLYCERIDES: 197 mg/dL — AB (ref <150)
VLDL: 39 mg/dL — ABNORMAL HIGH (ref <30)

## 2016-02-13 LAB — GLUCOSE, RANDOM: GLUCOSE: 99 mg/dL (ref 65–99)

## 2016-02-13 NOTE — Patient Instructions (Signed)
Finish out the prescription weekly vitamin D that you have.  If your levels are okay based on labs today, then switch over to the over-the-counter daily vitamin D3 of 2000 IU every day.  We will be in contact with your test results within 1-2 days.

## 2016-02-14 LAB — VITAMIN D 25 HYDROXY (VIT D DEFICIENCY, FRACTURES): VIT D 25 HYDROXY: 25 ng/mL — AB (ref 30–100)

## 2016-02-14 MED ORDER — VITAMIN D (ERGOCALCIFEROL) 1.25 MG (50000 UNIT) PO CAPS: 50000.0000 [IU] | ORAL_CAPSULE | ORAL | 0 refills | Status: DC

## 2016-02-14 MED ORDER — ATORVASTATIN CALCIUM 20 MG PO TABS: 20.0000 mg | ORAL_TABLET | Freq: Every day | ORAL | 2 refills | Status: DC

## 2016-02-14 NOTE — Addendum Note (Signed)
Addended by: Joselyn ArrowKNAPP, Edythe Riches on: 02/14/2016 07:59 AM   Modules accepted: Orders

## 2016-02-15 ENCOUNTER — Other Ambulatory Visit: Payer: Self-pay | Admitting: *Deleted

## 2016-02-15 DIAGNOSIS — E785 Hyperlipidemia, unspecified: Secondary | ICD-10-CM

## 2016-02-22 ENCOUNTER — Encounter: Payer: Self-pay | Admitting: Family Medicine

## 2016-02-27 ENCOUNTER — Other Ambulatory Visit: Payer: Self-pay | Admitting: Family Medicine

## 2016-03-01 ENCOUNTER — Other Ambulatory Visit: Payer: Self-pay

## 2016-04-19 ENCOUNTER — Other Ambulatory Visit: Payer: 59

## 2016-04-19 DIAGNOSIS — E785 Hyperlipidemia, unspecified: Secondary | ICD-10-CM

## 2016-04-19 LAB — LIPID PANEL
CHOL/HDL RATIO: 4.6 ratio (ref <5.0)
Cholesterol: 116 mg/dL (ref <200)
HDL: 25 mg/dL — AB (ref >40)
LDL CALC: 60 mg/dL (ref <100)
Triglycerides: 153 mg/dL — ABNORMAL HIGH (ref <150)
VLDL: 31 mg/dL — ABNORMAL HIGH (ref <30)

## 2016-04-19 LAB — HEPATIC FUNCTION PANEL
ALBUMIN: 4.2 g/dL (ref 3.6–5.1)
ALK PHOS: 83 U/L (ref 40–115)
ALT: 65 U/L — ABNORMAL HIGH (ref 9–46)
AST: 24 U/L (ref 10–40)
BILIRUBIN DIRECT: 0.1 mg/dL (ref <=0.2)
BILIRUBIN TOTAL: 0.6 mg/dL (ref 0.2–1.2)
Indirect Bilirubin: 0.5 mg/dL (ref 0.2–1.2)
Total Protein: 6.5 g/dL (ref 6.1–8.1)

## 2016-04-23 ENCOUNTER — Other Ambulatory Visit: Payer: Self-pay | Admitting: Family Medicine

## 2016-04-23 ENCOUNTER — Other Ambulatory Visit: Payer: Self-pay | Admitting: *Deleted

## 2016-04-23 DIAGNOSIS — E782 Mixed hyperlipidemia: Secondary | ICD-10-CM

## 2016-04-23 DIAGNOSIS — Z79899 Other long term (current) drug therapy: Secondary | ICD-10-CM

## 2016-07-23 ENCOUNTER — Other Ambulatory Visit: Payer: 59

## 2016-07-23 DIAGNOSIS — Z79899 Other long term (current) drug therapy: Secondary | ICD-10-CM

## 2016-07-23 DIAGNOSIS — E782 Mixed hyperlipidemia: Secondary | ICD-10-CM

## 2016-07-23 LAB — LIPID PANEL
CHOLESTEROL: 132 mg/dL (ref <200)
HDL: 27 mg/dL — AB (ref >40)
LDL CALC: 65 mg/dL (ref <100)
TRIGLYCERIDES: 202 mg/dL — AB (ref <150)
Total CHOL/HDL Ratio: 4.9 Ratio (ref <5.0)
VLDL: 40 mg/dL — AB (ref <30)

## 2016-07-23 LAB — HEPATIC FUNCTION PANEL
ALBUMIN: 4.3 g/dL (ref 3.6–5.1)
ALT: 71 U/L — AB (ref 9–46)
AST: 27 U/L (ref 10–40)
Alkaline Phosphatase: 97 U/L (ref 40–115)
Bilirubin, Direct: 0.1 mg/dL (ref <=0.2)
Indirect Bilirubin: 0.4 mg/dL (ref 0.2–1.2)
TOTAL PROTEIN: 6.9 g/dL (ref 6.1–8.1)
Total Bilirubin: 0.5 mg/dL (ref 0.2–1.2)

## 2016-07-25 NOTE — Progress Notes (Signed)
Chief Complaint  Patient presents with  . Follow-up    3 month follow up on cholesterol.     Patient presents to follow up on his cholesterol.  He has dyslipidemia--had mildly elevated LDL, high TG and low HDL.  He was started on Niacin in July, 2017 with only very slight improvement noted (TG remained elevated, HDL improved, but still low).  He had been only on 552m of an OTC niacin.  It was suggested to increase to 10015mand switch to prescription, but this was very expensive.  We also discussed Vascepa and Lovaza which also would not be affordable under the plan he had (high deductible, including for medications).  Instead, we added generic low dose fenofibrate once daily in August, and had him continue the OTC Niacin. He tolerated this without side effects, but wasn't effective. TG improved, but LDL was higher. Lipids on this regimen, in October 2017: Cholesterol 125 - 200 mg/dL 224    Triglycerides <150 mg/dL 197    HDL >=40 mg/dL 29    Total CHOL/HDL Ratio <=5.0 Ratio 7.7    VLDL <30 mg/dL 39    LDL Cholesterol <130 mg/dL 156      He was then started on atorvastatin 2062mFollow-up labs 2 months later were: Cholesterol <200 mg/dL 116   Triglycerides <150 mg/dL 153    HDL >40 mg/dL 25    Total CHOL/HDL Ratio <5.0 Ratio 4.6   VLDL <30 mg/dL 31    LDL Cholesterol <100 mg/dL 60    He now presents for 3 month f/u, and had labs prior to today's appointment: Lab Results  Component Value Date   CHOL 132 07/23/2016   HDL 27 (L) 07/23/2016   LDLCALC 65 07/23/2016   TRIG 202 (H) 07/23/2016   CHOLHDL 4.9 07/23/2016   Doesn't eat much sweets, but will have some chocolate, chips, fried foods (deep fried once a week, pan-frying 2x/week.  Didn't tolerate/like taking fish oil, tried a few types. He denies any myalgias or side effects.  Vitamin D deficiency--D levels were still low at 25 at last check in October. He was treated with additional 6 weeks of weekly prescription vitamin D, and is  now taking 2000 IU every day.  PMH, PSH, SH reviewed  Outpatient Encounter Prescriptions as of 07/26/2016  Medication Sig  . atorvastatin (LIPITOR) 20 MG tablet Take 1 tablet (20 mg total) by mouth daily.  . Cholecalciferol (VITAMIN D) 2000 units tablet Take 2,000 Units by mouth daily.  . [DISCONTINUED] atorvastatin (LIPITOR) 20 MG tablet TAKE ONE TABLET BY MOUTH ONCE DAILY  . [DISCONTINUED] niacin 500 MG tablet Take 500 mg by mouth at bedtime.  . [DISCONTINUED] Vitamin D, Ergocalciferol, (DRISDOL) 50000 units CAPS capsule Take 1 capsule (50,000 Units total) by mouth every 7 (seven) days.   No facility-administered encounter medications on file as of 07/26/2016.    No Known Allergies  ROS: no fever, chills, URI symptoms, headaches, dizziness, chest pain, palpitations, shortness of breath, myalgias, arthralgias, bleeding, bruising, rash or other complaints.   PHYSICAL EXAM:  BP 126/84 (BP Location: Left Arm, Patient Position: Sitting, Cuff Size: Normal)   Pulse 84   Ht 6' 0.75" (1.848 m)   Wt 208 lb 9.6 oz (94.6 kg)   BMI 27.71 kg/m   Wt Readings from Last 3 Encounters:  07/26/16 208 lb 9.6 oz (94.6 kg)  02/13/16 205 lb 9.6 oz (93.3 kg)  11/14/15 200 lb (90.7 kg)   Well appearing, pleasant male in no  distress HEENT: conjunctiva and sclera are clear, EOMI Neck: no lymphadenopathy, thyromegaly or mass Heart: regular rate and rhythm Lungs: clear bilaterally Abdomen: soft, nontender, no organomegaly or mass Extremities: no edema Neuro: alert and oriented, cranial nerves intact, normal strength, gait Psych: normal mood, affect, hygiene and grooming   ASSESSMENT/PLAN:  Mixed dyslipidemia - not at goal--TG remains high, low HDL, with chol/HDL ratio 4.9; discussed increasing statin dose, adding fish oil; prefers to work on diet and recheck 3 mos - Plan: atorvastatin (LIPITOR) 20 MG tablet, Lipid panel  Vitamin D deficiency - continue daily supplement.  recheck at physical - Plan:  VITAMIN D 25 Hydroxy (Vit-D Deficiency, Fractures)  Metabolic syndrome  Impaired fasting glucose - discussed proper diet, daily exercise and weight loss - Plan: Comprehensive metabolic panel  Medication monitoring encounter - Plan: VITAMIN D 25 Hydroxy (Vit-D Deficiency, Fractures), Lipid panel, Comprehensive metabolic panel  Dyslipidemia--chol/HDL ratio remains above goal, HDL very low, TG remains elevated.  LDL is good.  He prefers to stay on current dose, work on diet. If not improved at his physical, then will increase lipitor to the 73m dose as discussed today. Counseled re: lowfat, low cholesterol diet.   CPE in July, labs prior Vit D, lipids, c-met (CBC and TSH normal last year)

## 2016-07-26 ENCOUNTER — Ambulatory Visit (INDEPENDENT_AMBULATORY_CARE_PROVIDER_SITE_OTHER): Payer: 59 | Admitting: Family Medicine

## 2016-07-26 ENCOUNTER — Encounter: Payer: Self-pay | Admitting: Family Medicine

## 2016-07-26 VITALS — BP 126/84 | HR 84 | Ht 72.75 in | Wt 208.6 lb

## 2016-07-26 DIAGNOSIS — E782 Mixed hyperlipidemia: Secondary | ICD-10-CM

## 2016-07-26 DIAGNOSIS — E559 Vitamin D deficiency, unspecified: Secondary | ICD-10-CM | POA: Diagnosis not present

## 2016-07-26 DIAGNOSIS — R7301 Impaired fasting glucose: Secondary | ICD-10-CM | POA: Diagnosis not present

## 2016-07-26 DIAGNOSIS — E8881 Metabolic syndrome: Secondary | ICD-10-CM | POA: Diagnosis not present

## 2016-07-26 DIAGNOSIS — Z5181 Encounter for therapeutic drug level monitoring: Secondary | ICD-10-CM

## 2016-07-26 MED ORDER — ATORVASTATIN CALCIUM 20 MG PO TABS: 20.0000 mg | ORAL_TABLET | Freq: Every day | ORAL | 0 refills | Status: DC

## 2016-07-26 NOTE — Patient Instructions (Signed)
We discussed options of increasing the atorvastatin to 40mg , vs keeping at 20mg  and cutting back on sweets and fried foods.  Try and get at least 150 minutes of aerobic exercise each week.  We will recheck your labs in July, and discuss medications again at that time.

## 2016-07-27 ENCOUNTER — Encounter: Payer: Self-pay | Admitting: Family Medicine

## 2016-11-08 ENCOUNTER — Other Ambulatory Visit: Payer: Self-pay | Admitting: Family Medicine

## 2016-11-08 DIAGNOSIS — E782 Mixed hyperlipidemia: Secondary | ICD-10-CM

## 2016-11-19 ENCOUNTER — Other Ambulatory Visit: Payer: 59

## 2016-11-19 DIAGNOSIS — E559 Vitamin D deficiency, unspecified: Secondary | ICD-10-CM

## 2016-11-19 DIAGNOSIS — Z5181 Encounter for therapeutic drug level monitoring: Secondary | ICD-10-CM

## 2016-11-19 DIAGNOSIS — E782 Mixed hyperlipidemia: Secondary | ICD-10-CM

## 2016-11-19 DIAGNOSIS — R7301 Impaired fasting glucose: Secondary | ICD-10-CM

## 2016-11-19 LAB — COMPREHENSIVE METABOLIC PANEL
ALT: 59 U/L — AB (ref 9–46)
AST: 23 U/L (ref 10–40)
Albumin: 4.3 g/dL (ref 3.6–5.1)
Alkaline Phosphatase: 107 U/L (ref 40–115)
BILIRUBIN TOTAL: 0.7 mg/dL (ref 0.2–1.2)
BUN: 12 mg/dL (ref 7–25)
CALCIUM: 9 mg/dL (ref 8.6–10.3)
CO2: 22 mmol/L (ref 20–31)
Chloride: 105 mmol/L (ref 98–110)
Creat: 0.96 mg/dL (ref 0.60–1.35)
GLUCOSE: 105 mg/dL — AB (ref 65–99)
Potassium: 4.4 mmol/L (ref 3.5–5.3)
Sodium: 140 mmol/L (ref 135–146)
TOTAL PROTEIN: 6.8 g/dL (ref 6.1–8.1)

## 2016-11-19 LAB — LIPID PANEL
CHOL/HDL RATIO: 4 ratio (ref <5.0)
Cholesterol: 109 mg/dL (ref <200)
HDL: 27 mg/dL — AB (ref >40)
LDL CALC: 47 mg/dL (ref <100)
TRIGLYCERIDES: 176 mg/dL — AB (ref <150)
VLDL: 35 mg/dL — AB (ref <30)

## 2016-11-20 LAB — VITAMIN D 25 HYDROXY (VIT D DEFICIENCY, FRACTURES): Vit D, 25-Hydroxy: 37 ng/mL (ref 30–100)

## 2016-11-20 NOTE — Progress Notes (Signed)
Chief Complaint  Patient presents with  . Annual Exam    fasting annual exam (labs already done). Had eye exam recently. No other concerns.     John Frazier is a 46 y.o. male who presents for a complete physical.  He has the following concerns:  Left great toe--has been having some bleeding from the base of the nail for about 2-3 months.  No bleeding spontaneously, only when he tries to clean the "crevice" and cleans out the scabs does it start bleeding. Denies any pain or known trauma.  Patient presents to follow up on his cholesterol. He has dyslipidemia--had mildly elevated LDL, high TG and low HDL. He was started on Niacin in July, 2017 with only very slight improvement noted (TG remained elevated, HDL improved, but still low). He had been only on 538m of an OTC niacin. It was suggested to increase to 10092mand switch to prescription, but this was very expensive. We also discussed Vascepa and Lovaza which also would not be affordable under the plan he had (high deductible, including for medications). Instead, we addedgeneric low dose fenofibrate once daily in August, and had him continue the OTC Niacin. His LDL increased to 156 in October, so the fenofibrate and Niacin were both stopped, changed to 2050mf atorvastatin.  He is taking this without side effects.  His last lipids were not at goal (TG remained around 200), but rather than increasing statin or making other changes, he wanted to work harder on diet. He takes sporadic fish oil, every other day, 1 capsule.  He had repeat labs drawn prior to his visit, see below.  He has been good about sweets and fried foods (using an air fryer instead).  Hasn't been good about salty foods (chips, tries to get baked).  He has also had impaired fasting glucose, for which he has been working on his diet,as above.  Vitamin D deficiency:  Treated in past with rx. Currently taking 2000 IU daily.   Immunization History  Administered Date(s)  Administered  . Tdap 01/08/2013   Declines flu shots Last colonoscopy: never Last PSA: never Dentist: regularly every 6 months Ophtho: yearly Exercise: some swimming in the pool this summer (swimming around his floating wife).  Feels sore after riding motorcycle.  No longer getting in 30 minutes on the treadmill before work.  History reviewed. No pertinent past medical history.  History reviewed. No pertinent surgical history.  Social History   Social History  . Marital status: Single    Spouse name: N/A  . Number of children: N/A  . Years of education: N/A   Occupational History  . 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 Smoking status: Never Smoker  . Smokeless tobacco: Never Used  . Alcohol use Yes     Comment: 0-2 drinks per week.  . Drug use: No  . Sexual activity: Yes    Partners: Female   Other Topics Concern  . Not on file   Social History Narrative   Married,  2 cats, 1 dog.  No tobacco exposure.    Works from home    Family History  Problem Relation Age of Onset  . Hypertension Mother   . Stroke Mother 71 32    shortly after CABG; multiple strokes since then  . Heart disease Mother 69 73    CABG x 2  . Diabetes Mother   . Hyperlipidemia Mother   . Seizures Brother   .  Diabetes Brother   . Cancer Maternal Uncle        lung, smoker  . Cancer Maternal Grandmother        type not known  . Cancer Maternal Uncle        ?type; abdominal  . Alcohol abuse Maternal Uncle   . Prostate cancer Neg Hx     Outpatient Encounter Prescriptions as of 11/21/2016  Medication Sig  . atorvastatin (LIPITOR) 20 MG tablet TAKE 1 TABLET BY MOUTH ONCE DAILY  . Cholecalciferol (VITAMIN D) 2000 units tablet Take 2,000 Units by mouth daily.   No facility-administered encounter medications on file as of 11/21/2016.   1 fish oil every other day  No Known Allergies  ROS: The patient denies anorexia, fever, headaches, vision loss, decreased  hearing, ear pain, hoarseness, chest pain, palpitations, dizziness, syncope, dyspnea on exertion, cough, swelling, nausea, vomiting, diarrhea, abdominal pain, melena, indigestion/heartburn, hematuria, incontinence, erectile dysfunction, nocturia, weakened urine stream, dysuria, genital lesions, joint pains, numbness, tingling, weakness, tremor, suspicious skin lesions, depression, anxiety, abnormal bleeding/bruising, or enlarged lymph nodes Toe bleeding per HPI. No further constipation or BRB in the last 3 months. No weight changes.    PHYSICAL EXAM:  BP (!) 130/96 (BP Location: Right Arm, Patient Position: Sitting, Cuff Size: Normal)   Pulse 80   Ht _0  (1.854 m)   Wt 208 lb 9.6 oz (94.6 kg)   BMI 27.52 kg/m    122/88 on repeat by MD  Wt Readings from Last 3 Encounters:  07/26/16 208 lb 9.6 oz (94.6 kg)  02/13/16 205 lb 9.6 oz (93.3 kg)  11/14/15 200 lb (90.7 kg)    General Appearance:   Alert, cooperative, no distress, appears stated age  Head:   Normocephalic, without obvious abnormality, atraumatic  Eyes:   PERRL, conjunctiva/corneas clear, EOM's intact, fundi benign  Ears:   Normal TM's and external ear canals  Nose:  Nares normal, mucosa normal, no drainage or sinus tenderness  Throat:  Lips, mucosa, and tongue normal  Neck:  Supple, no lymphadenopathy; thyroid: no enlargement/tenderness/nodules; no carotid bruit or JVD  Back:  Spine nontender, no curvature, ROM normal, no CVAtenderness  Lungs:   Clear to auscultation bilaterally without wheezes, rales or ronchi; respirations unlabored  Chest Wall:   No tenderness or deformity  Heart:   Regular rate and rhythm, S1 and S2 normal, no murmur, rub or gallop  Breast Exam:   No chest wall tenderness, masses or gynecomastia  Abdomen:   Soft, non-tender, nondistended, normoactive bowel sounds, no masses, no hepatosplenomegaly  Genitalia:   Normal male external genitalia without  lesions. Testicles without masses. No inguinal hernias.  Rectal:   Normal sphincter tone, no masses.  Heme negative light brown stool  Extremities:  No clubbing, cyanosis or edema  Pulses:  2+ and symmetric all extremities  Skin:  Skin color, texture, turgor normal, no rashes or lesions. Benign moles noted on chest, back. Discoloration of bilateral great toenails (distal half of nails only, straight edge, yellowed, not thickened).  Slight scab at base of the lateral aspect of left great toenail.  No abnormal pigmentation under the nail. Nontender, no erythema or swelling, no paronychia.  Lymph nodes:  Cervical, supraclavicular, and axillary nodes normal  Neurologic:  CNII-XII intact, normal strength, sensation and gait; reflexes 2+ and symmetric throughout   Psych:Normal mood, affect, hygiene and grooming.   Lab Results  Component Value Date   CHOL 109 11/19/2016   HDL 27 (L) 11/19/2016  LDLCALC 47 11/19/2016   TRIG 176 (H) 11/19/2016   CHOLHDL 4.0 11/19/2016     Chemistry      Component Value Date/Time   NA 140 11/19/2016 1242   K 4.4 11/19/2016 1242   CL 105 11/19/2016 1242   CO2 22 11/19/2016 1242   BUN 12 11/19/2016 1242   CREATININE 0.96 11/19/2016 1242      Component Value Date/Time   CALCIUM 9.0 11/19/2016 1242   ALKPHOS 107 11/19/2016 1242   AST 23 11/19/2016 1242   ALT 59 (H) 11/19/2016 1242   BILITOT 0.7 11/19/2016 1242     Fasting glucose 105  Vitamin D-OH 37   ASSESSMENT/PLAN:  Annual physical exam - Plan: POCT Urinalysis Dipstick  Mixed dyslipidemia - continue statin. Reviewed lowfat, low cholesterol diet.  Increase fish oil - Plan: Lipid panel  Metabolic syndrome - encouraged daily exercise, weight loss  Vitamin D deficiency - continue current supplementation, adequately replaced  Impaired fasting glucose - continue low sugar/carb diet. reviewed healthier snacks. Daily exercise, weight loss - Plan:  Hemoglobin A1c, Glucose, random  Medication monitoring encounter - Plan: Hepatic function panel   Recommended at least 30 minutes of aerobic activity at least 5 days/week, weight-bearing exercise at least 2x/wk; proper sunscreen use reviewed; healthy diet and alcohol recommendations (less than or equal to 2 drinks/day) reviewed; regular seatbelt use; changing batteries in smoke detectors. Self-testicular exams. Immunization recommendations discussed--yearly flu shots recommended, shingrix age 63; colonoscopy age 30, sooner prn   F/u 6 months with labs prior (Lipids, LFT, glucose, A1c)  (high deductible plan, hasn't met yet--CPE only billed)   Please avoid picking at the scab on the toe.  Clean with soapy warm soaks, and apply an antibacterial ointment afterwards.  I recommend seeing a podiatrist if this doesn't completely resolve.  Continue the lowfat, low cholesterol diet. Try and take 3-4 fish oil capsules daily--get your own bottle and keep with your medications that you take daily. Increase your exercise to get a minimum of 150 minutes/week of cardio (plus weight-bearing exercise at least 2x/week)

## 2016-11-21 ENCOUNTER — Ambulatory Visit (INDEPENDENT_AMBULATORY_CARE_PROVIDER_SITE_OTHER): Payer: 59 | Admitting: Family Medicine

## 2016-11-21 ENCOUNTER — Encounter: Payer: Self-pay | Admitting: Family Medicine

## 2016-11-21 VITALS — BP 122/88 | HR 80 | Ht 73.0 in | Wt 208.6 lb

## 2016-11-21 DIAGNOSIS — Z5181 Encounter for therapeutic drug level monitoring: Secondary | ICD-10-CM

## 2016-11-21 DIAGNOSIS — Z0000 Encounter for general adult medical examination without abnormal findings: Secondary | ICD-10-CM | POA: Diagnosis not present

## 2016-11-21 DIAGNOSIS — R7301 Impaired fasting glucose: Secondary | ICD-10-CM | POA: Diagnosis not present

## 2016-11-21 DIAGNOSIS — E8881 Metabolic syndrome: Secondary | ICD-10-CM | POA: Diagnosis not present

## 2016-11-21 DIAGNOSIS — E782 Mixed hyperlipidemia: Secondary | ICD-10-CM | POA: Diagnosis not present

## 2016-11-21 DIAGNOSIS — E559 Vitamin D deficiency, unspecified: Secondary | ICD-10-CM | POA: Diagnosis not present

## 2016-11-21 LAB — POCT URINALYSIS DIPSTICK
Bilirubin, UA: NEGATIVE
Blood, UA: NEGATIVE
Glucose, UA: NEGATIVE
Ketones, UA: NEGATIVE
LEUKOCYTES UA: NEGATIVE
Nitrite, UA: NEGATIVE
PROTEIN UA: NEGATIVE
Spec Grav, UA: >=1.03 — AB (ref 1.010–1.025)
UROBILINOGEN UA: NEGATIVE U/dL — AB
pH, UA: 5.5 (ref 5.0–8.0)

## 2016-11-21 NOTE — Patient Instructions (Addendum)
HEALTH MAINTENANCE RECOMMENDATIONS:  It is recommended that you get at least 30 minutes of aerobic exercise at least 5 days/week (for weight loss, you may need as much as 60-90 minutes). This can be any activity that gets your heart rate up. This can be divided in 10-15 minute intervals if needed, but try and build up your endurance at least once a week.  Weight bearing exercise is also recommended twice weekly.  Eat a healthy diet with lots of vegetables, fruits and fiber.  "Colorful" foods have a lot of vitamins (ie green vegetables, tomatoes, red peppers, etc).  Limit sweet tea, regular sodas and alcoholic beverages, all of which has a lot of calories and sugar.  Up to 2 alcoholic drinks daily may be beneficial for men (unless trying to lose weight, watch sugars).  Drink a lot of water.  Sunscreen of at least SPF 30 should be used on all sun-exposed parts of the skin when outside between the hours of 10 am and 4 pm (not just when at beach or pool, but even with exercise, golf, tennis, and yard work!)  Use a sunscreen that says "broad spectrum" so it covers both UVA and UVB rays, and make sure to reapply every 1-2 hours.  Remember to change the batteries in your smoke detectors when changing your clock times in the spring and fall.  Use your seat belt every time you are in a car, and please drive safely and not be distracted with cell phones and texting while driving.   Please avoid picking at the scab on the toe.  Clean with soapy warm soaks, and apply an antibacterial ointment afterwards.  I recommend seeing a podiatrist if this doesn't completely resolve.  Continue the lowfat, low cholesterol diet. Try and take 3-4 fish oil capsules daily--get your own bottle and keep with your medications that you take daily. Increase your exercise to get a minimum of 150 minutes/week of cardio (plus weight-bearing exercise at least 2x/week)  Your fasting sugar remained slightly elevated, pre-diabetic.   Continue to limit your sweets and carbs.  Daily exercise and weight loss will also help keep the sugar down.  Overall your lipid panel improved.  Your LDL (bad kind) is excellent.  The triglycerides improved, although goal is still to be less than 150.  Increasing the fish oil and taking that daily will help.  Increasing your exercise (along with the fish oil) may also help raise the HDL some.  Your chol/HDL ratio is now at 4, which is much better.  Your blood pressure was borderline today.  Regular exercise and weight loss will help this, as well as cutting back on sodium in the diet (chips, chinese food, etc).   DASH Eating Plan DASH stands for "Dietary Approaches to Stop Hypertension." The DASH eating plan is a healthy eating plan that has been shown to reduce high blood pressure (hypertension). It may also reduce your risk for type 2 diabetes, heart disease, and stroke. The DASH eating plan may also help with weight loss. What are tips for following this plan? General guidelines  Avoid eating more than 2,300 mg (milligrams) of salt (sodium) a day. If you have hypertension, you may need to reduce your sodium intake to 1,500 mg a day.  Limit alcohol intake to no more than 1 drink a day for nonpregnant women and 2 drinks a day for men. One drink equals 12 oz of beer, 5 oz of wine, or 1 oz of hard liquor.  Work  with your health care provider to maintain a healthy body weight or to lose weight. Ask what an ideal weight is for you.  Get at least 30 minutes of exercise that causes your heart to beat faster (aerobic exercise) most days of the week. Activities may include walking, swimming, or biking.  Work with your health care provider or diet and nutrition specialist (dietitian) to adjust your eating plan to your individual calorie needs. Reading food labels  Check food labels for the amount of sodium per serving. Choose foods with less than 5 percent of the Daily Value of sodium. Generally,  foods with less than 300 mg of sodium per serving fit into this eating plan.  To find whole grains, look for the word "whole" as the first word in the ingredient list. Shopping  Buy products labeled as "low-sodium" or "no salt added."  Buy fresh foods. Avoid canned foods and premade or frozen meals. Cooking  Avoid adding salt when cooking. Use salt-free seasonings or herbs instead of table salt or sea salt. Check with your health care provider or pharmacist before using salt substitutes.  Do not fry foods. Cook foods using healthy methods such as baking, boiling, grilling, and broiling instead.  Cook with heart-healthy oils, such as olive, canola, soybean, or sunflower oil. Meal planning   Eat a balanced diet that includes: ? 5 or more servings of fruits and vegetables each day. At each meal, try to fill half of your plate with fruits and vegetables. ? Up to 6-8 servings of whole grains each day. ? Less than 6 oz of lean meat, poultry, or fish each day. A 3-oz serving of meat is about the same size as a deck of cards. One egg equals 1 oz. ? 2 servings of low-fat dairy each day. ? A serving of nuts, seeds, or beans 5 times each week. ? Heart-healthy fats. Healthy fats called Omega-3 fatty acids are found in foods such as flaxseeds and coldwater fish, like sardines, salmon, and mackerel.  Limit how much you eat of the following: ? Canned or prepackaged foods. ? Food that is high in trans fat, such as fried foods. ? Food that is high in saturated fat, such as fatty meat. ? Sweets, desserts, sugary drinks, and other foods with added sugar. ? Full-fat dairy products.  Do not salt foods before eating.  Try to eat at least 2 vegetarian meals each week.  Eat more home-cooked food and less restaurant, buffet, and fast food.  When eating at a restaurant, ask that your food be prepared with less salt or no salt, if possible. What foods are recommended? The items listed may not be a  complete list. Talk with your dietitian about what dietary choices are best for you. Grains Whole-grain or whole-wheat bread. Whole-grain or whole-wheat pasta. Brown rice. Orpah Cobb. Bulgur. Whole-grain and low-sodium cereals. Pita bread. Low-fat, low-sodium crackers. Whole-wheat flour tortillas. Vegetables Fresh or frozen vegetables (raw, steamed, roasted, or grilled). Low-sodium or reduced-sodium tomato and vegetable juice. Low-sodium or reduced-sodium tomato sauce and tomato paste. Low-sodium or reduced-sodium canned vegetables. Fruits All fresh, dried, or frozen fruit. Canned fruit in natural juice (without added sugar). Meat and other protein foods Skinless chicken or [place]. Ground chicken or [place]. Pork with fat trimmed off. Fish and seafood. Egg whites. Dried beans, peas, or lentils. Unsalted nuts, nut butters, and seeds. Unsalted canned beans. Lean cuts of beef with fat trimmed off. Low-sodium, lean deli meat. Dairy Low-fat (1%) or fat-free (skim) milk.  Fat-free, low-fat, or reduced-fat cheeses. Nonfat, low-sodium ricotta or cottage cheese. Low-fat or nonfat yogurt. Low-fat, low-sodium cheese. Fats and oils Soft margarine without trans fats. Vegetable oil. Low-fat, reduced-fat, or light mayonnaise and salad dressings (reduced-sodium). Canola, safflower, olive, soybean, and sunflower oils. Avocado. Seasoning and other foods Herbs. Spices. Seasoning mixes without salt. Unsalted popcorn and pretzels. Fat-free sweets. What foods are not recommended? The items listed may not be a complete list. Talk with your dietitian about what dietary choices are best for you. Grains Baked goods made with fat, such as croissants, muffins, or some breads. Dry pasta or rice meal packs. Vegetables Creamed or fried vegetables. Vegetables in a cheese sauce. Regular canned vegetables (not low-sodium or reduced-sodium). Regular canned tomato sauce and paste (not low-sodium or reduced-sodium). Regular  tomato and vegetable juice (not low-sodium or reduced-sodium). Rosita FirePickles. Olives. Fruits Canned fruit in a light or heavy syrup. Fried fruit. Fruit in cream or butter sauce. Meat and other protein foods Fatty cuts of meat. Ribs. Fried meat. Tomasa BlaseBacon. Sausage. Bologna and other processed lunch meats. Salami. Fatback. Hotdogs. Bratwurst. Salted nuts and seeds. Canned beans with added salt. Canned or smoked fish. Whole eggs or egg yolks. Chicken or [place] with skin. Dairy Whole or 2% milk, cream, and half-and-half. Whole or full-fat cream cheese. Whole-fat or sweetened yogurt. Full-fat cheese. Nondairy creamers. Whipped toppings. Processed cheese and cheese spreads. Fats and oils Butter. Stick margarine. Lard. Shortening. Ghee. Bacon fat. Tropical oils, such as coconut, palm kernel, or palm oil. Seasoning and other foods Salted popcorn and pretzels. Onion salt, garlic salt, seasoned salt, table salt, and sea salt. Worcestershire sauce. Tartar sauce. Barbecue sauce. Teriyaki sauce. Soy sauce, including reduced-sodium. Steak sauce. Canned and packaged gravies. Fish sauce. Oyster sauce. Cocktail sauce. Horseradish that you find on the shelf. Ketchup. Mustard. Meat flavorings and tenderizers. Bouillon cubes. Hot sauce and Tabasco sauce. Premade or packaged marinades. Premade or packaged taco seasonings. Relishes. Regular salad dressings. Where to find more information:  National Heart, Lung, and Blood Institute: PopSteam.iswww.nhlbi.nih.gov  American Heart Association: www.heart.org Summary  The DASH eating plan is a healthy eating plan that has been shown to reduce high blood pressure (hypertension). It may also reduce your risk for type 2 diabetes, heart disease, and stroke.  With the DASH eating plan, you should limit salt (sodium) intake to 2,300 mg a day. If you have hypertension, you may need to reduce your sodium intake to 1,500 mg a day.  When on the DASH eating plan, aim to eat more fresh fruits and  vegetables, whole grains, lean proteins, low-fat dairy, and heart-healthy fats.  Work with your health care provider or diet and nutrition specialist (dietitian) to adjust your eating plan to your individual calorie needs. This information is not intended to replace advice given to you by your health care provider. Make sure you discuss any questions you have with your health care provider. Document Released: 04/19/2011 Document Revised: 04/23/2016 Document Reviewed: 04/23/2016 Elsevier Interactive Patient Education  2017 [employer]

## 2017-03-01 ENCOUNTER — Other Ambulatory Visit: Payer: Self-pay | Admitting: Family Medicine

## 2017-03-01 DIAGNOSIS — E782 Mixed hyperlipidemia: Secondary | ICD-10-CM

## 2017-05-20 ENCOUNTER — Other Ambulatory Visit: Payer: 59

## 2017-05-20 DIAGNOSIS — E782 Mixed hyperlipidemia: Secondary | ICD-10-CM

## 2017-05-20 DIAGNOSIS — Z5181 Encounter for therapeutic drug level monitoring: Secondary | ICD-10-CM

## 2017-05-20 DIAGNOSIS — R7301 Impaired fasting glucose: Secondary | ICD-10-CM

## 2017-05-21 LAB — HEMOGLOBIN A1C
Hgb A1c MFr Bld: 5.7 % of total Hgb — ABNORMAL HIGH (ref <5.7)
Mean Plasma Glucose: 117 (calc)
eAG (mmol/L): 6.5 (calc)

## 2017-05-21 LAB — HEPATIC FUNCTION PANEL
AG RATIO: 1.7 (calc) (ref 1.0–2.5)
ALBUMIN MSPROF: 4.4 g/dL (ref 3.6–5.1)
ALT: 56 U/L — ABNORMAL HIGH (ref 9–46)
AST: 25 U/L (ref 10–40)
Alkaline phosphatase (APISO): 106 U/L (ref 40–115)
BILIRUBIN TOTAL: 0.8 mg/dL (ref 0.2–1.2)
Bilirubin, Direct: 0.1 mg/dL (ref 0.0–0.2)
GLOBULIN: 2.6 g/dL (ref 1.9–3.7)
Indirect Bilirubin: 0.7 mg/dL (calc) (ref 0.2–1.2)
Total Protein: 7 g/dL (ref 6.1–8.1)

## 2017-05-21 LAB — LIPID PANEL
CHOL/HDL RATIO: 6 (calc) — AB (ref <5.0)
Cholesterol: 162 mg/dL (ref <200)
HDL: 27 mg/dL — ABNORMAL LOW (ref >40)
LDL CHOLESTEROL (CALC): 96 mg/dL
NON-HDL CHOLESTEROL (CALC): 135 mg/dL — AB (ref <130)
TRIGLYCERIDES: 295 mg/dL — AB (ref <150)

## 2017-05-21 LAB — GLUCOSE, RANDOM: Glucose, Bld: 112 mg/dL — ABNORMAL HIGH (ref 65–99)

## 2017-05-21 NOTE — Progress Notes (Signed)
Chief Complaint  Patient presents with  . Hyperlipidemia    nonfasting med check. Labs already done.     Patient presents to follow up on his cholesterol. He has dyslipidemia--had mildly elevated LDL, high TG and low HDL. He was started on Niacin in July, 2017 with only very slight improvement noted (TG remained elevated, HDL improved, but still low). He had been only on 500mg  of an OTC niacin. It was suggested to increase to 1000mg  and switch to prescription, but this was very expensive. We also discussed Vascepa and Lovaza which also would not be affordable under the plan he had (high deductible, including for medications). Instead, we addedgeneric low dose fenofibrate once daily in August, and had him continue the OTC Niacin. His LDL increased to 156 in October 2017, so the fenofibrate and Niacin were both stopped, changed to 20mg  of atorvastatin.  He is taking this without side effects.  His last lipids were not at goal (TG remained around 200), but rather than increasing statin or making other changes, he wanted to work harder on diet and increase omega-3 fish oil.   He reports he did well "for a while". Admits that his diet hasn't been good lately. He hasn't taken any fish oil in a while, since he ran out.    Wife is struggling with her mother's stage 4 cancer (breast, spread to bone)--she stopped Weight [employer], feeding her feelings, gained weight. So, his diet has been worse.  Eating out more (she doesn't feel like cooking), larger portions, not as healthy. Wife is wanting to move again, he doesn't it--feels very comfortable in their current home.  Making him anxious/stressed, a lot of uncertainty.  Exercise--Walking 2-3 days/week x 15-20 minutes.  He has also had impaired fasting glucose. He had labs prior to visit, see below.  Vitamin D deficiency:  Treated in past with rx. Currently taking 2000 IU daily. Level as normal in July at 37.  +canned foods, +chinese food, eating out  more.  PMH, PSH, SH reviewed  Outpatient Encounter Medications as of 05/22/2017  Medication Sig  . atorvastatin (LIPITOR) 20 MG tablet Take 1 tablet (20 mg total) by mouth daily.  . Cholecalciferol (VITAMIN D) 2000 units tablet Take 2,000 Units by mouth daily.  . [DISCONTINUED] atorvastatin (LIPITOR) 20 MG tablet TAKE 1 TABLET BY MOUTH ONCE DAILY   No facility-administered encounter medications on file as of 05/22/2017.    No Known Allergies  ROS:  10# weight gain since last visit. No headaches, dizziness, URI symptoms, chest pain, shortness of breath, GI or GU complaints, bleeding, bruising, rash.  Some stress (see HPI), moods okay overall.   PHYSICAL EXAM:  BP (!) 150/90   Pulse 84   Ht 6\' 1"  (1.854 m)   Wt 218 lb 3.2 oz (99 kg)   BMI 28.79 kg/m   140/86 on repeat by MD  Wt Readings from Last 3 Encounters:  05/22/17 218 lb 3.2 oz (99 kg)  11/21/16 208 lb 9.6 oz (94.6 kg)  07/26/16 208 lb 9.6 oz (94.6 kg)   Pleasant, well-appearing male in good spirits, in no distress HEENT: EOMI, conjunctiva and sclera are clear Neck: no lymphadenopathy, thyromegaly or mass Heart: regular rate and rhythm Lungs: clear bilaterally Back: no spinal or CVA tenderness Abdomen: soft, nontender, no mass Extremities: no edema, normal pulses Psych: normal mood, affect, hygiene and grooming Neuro: alert and oriented, cranial nerves intact, normal gait   Lab Results  Component Value Date   HGBA1C 5.7 (  H) 05/20/2017   Fasting glucose 112  Lab Results  Component Value Date   CHOL 162 05/20/2017   HDL 27 (L) 05/20/2017   LDLCALC 47 11/19/2016   TRIG 295 (H) 05/20/2017   CHOLHDL 6.0 (H) 05/20/2017   Lab Results  Component Value Date   ALT 56 (H) 05/20/2017   AST 25 05/20/2017   ALKPHOS 107 11/19/2016   BILITOT 0.8 05/20/2017     ASSESSMENT/PLAN:  Mixed dyslipidemia - poorly controlled--poor diet choices, not taking fish oil. Declines change in meds, improve diet and recheck 6 mos;  restart fish oil - Plan: atorvastatin (LIPITOR) 20 MG tablet, Lipid panel  Metabolic syndrome  Impaired fasting glucose - counseled re: risks, diet, exercise, weight loss - Plan: Hemoglobin A1c, Comprehensive metabolic panel  Vitamin D deficiency - continue daily supplement  Medication monitoring encounter - Plan: CBC with Differential/Platelet, Lipid panel, Comprehensive metabolic panel  Weight gain - counseled re: healthy diet choices, portion control, exercise - Plan: TSH   Refuses flu shot (again).  F/u as scheduled in July, labs prior.  Counseled re: diet, exercise, weight loss 25-30 min visit, more than 1/2 spent counseling.   Please restart taking omega-3 fish oil 3000-4000mg  daily (get 1000mg  capsules, take 2 twice daily with meals, vs 1200mg  taking 3/day). Cut back on fried foods. Continue to avoid carbs, sugar, sweets.  Increase your aerobic exercise to at least 150 minutes/week. Remember this needs to be in 10-15 minute intervals.   Try to cook healthier meals at home, eat out less, or share more foods. Choose healthy options. Portion control is important in trying to get the weight off that you gained.  Food choices will get your cholesterol back down to a better range. Your cholesterol went up significantly since the last check (both the LDL and the triglycerides).  You prefer at this point to work harder on improving your diet (and restart the fish oil) rather than increasing the dose of the medication.  I know that if you can maintain a healthy diet, weight and exercise, that this should get you at least close to your goals, as you were much closer last check.   If you cannot maintain these good levels, then I will need to adjust your medication dose up in the future.  Please try and cut back on the salt/sodium in your diet (see info)

## 2017-05-22 ENCOUNTER — Ambulatory Visit (INDEPENDENT_AMBULATORY_CARE_PROVIDER_SITE_OTHER): Payer: 59 | Admitting: Family Medicine

## 2017-05-22 ENCOUNTER — Encounter: Payer: Self-pay | Admitting: Family Medicine

## 2017-05-22 VITALS — BP 140/86 | HR 84 | Ht 73.0 in | Wt 218.2 lb

## 2017-05-22 DIAGNOSIS — Z5181 Encounter for therapeutic drug level monitoring: Secondary | ICD-10-CM

## 2017-05-22 DIAGNOSIS — E8881 Metabolic syndrome: Secondary | ICD-10-CM

## 2017-05-22 DIAGNOSIS — E782 Mixed hyperlipidemia: Secondary | ICD-10-CM | POA: Diagnosis not present

## 2017-05-22 DIAGNOSIS — R7301 Impaired fasting glucose: Secondary | ICD-10-CM | POA: Diagnosis not present

## 2017-05-22 DIAGNOSIS — R635 Abnormal weight gain: Secondary | ICD-10-CM

## 2017-05-22 DIAGNOSIS — E559 Vitamin D deficiency, unspecified: Secondary | ICD-10-CM

## 2017-05-22 MED ORDER — ATORVASTATIN CALCIUM 20 MG PO TABS: 20.0000 mg | ORAL_TABLET | Freq: Every day | ORAL | 1 refills | Status: DC

## 2017-05-22 NOTE — Patient Instructions (Addendum)
Please restart taking omega-3 fish oil 3000-4000mg  daily (get 1000mg  capsules, take 2 twice daily with meals, vs 1200mg  taking 3/day). Cut back on fried foods. Continue to avoid carbs, sugar, sweets.  Increase your aerobic exercise to at least 150 minutes/week. Remember this needs to be in 10-15 minute intervals.   Try to cook healthier meals at home, eat out less, or share more foods. Choose healthy options. Portion control is important in trying to get the weight off that you gained.  Food choices will get your cholesterol back down to a better range. Your cholesterol went up significantly since the last check (both the LDL and the triglycerides).  You prefer at this point to work harder on improving your diet (and restart the fish oil) rather than increasing the dose of the medication.  I know that if you can maintain a healthy diet, weight and exercise, that this should get you at least close to your goals, as you were much closer last check.   If you cannot maintain these good levels, then I will need to adjust your medication dose up in the future. If you find that you are not able to make the changes that you know you need to, you can let us know and we can increase the atorvastatin dose prior to your next check.  Please try and cut back on the salt/sodium in your diet (see info)   Low-Sodium Eating Plan Sodium, which is an element that makes up salt, helps you maintain a healthy balance of fluids in your body. Too much sodium can increase your blood pressure and cause fluid and waste to be held in your body. Your health care provider or dietitian may recommend following this plan if you have high blood pressure (hypertension), kidney disease, liver disease, or heart failure. Eating less sodium can help lower your blood pressure, reduce swelling, and protect your heart, liver, and kidneys. What are tips for following this plan? General guidelines  Most people on this plan should limit  their sodium intake to 1,500-2,000 mg (milligrams) of sodium each day. Reading food labels  The Nutrition Facts label lists the amount of sodium in one serving of the food. If you eat more than one serving, you must multiply the listed amount of sodium by the number of servings.  Choose foods with less than 140 mg of sodium per serving.  Avoid foods with 300 mg of sodium or more per serving. Shopping  Look for lower-sodium products, often labeled as "low-sodium" or "no salt added."  Always check the sodium content even if foods are labeled as "unsalted" or "no salt added".  Buy fresh foods. ? Avoid canned foods and premade or frozen meals. ? Avoid canned, cured, or processed meats  Buy breads that have less than 80 mg of sodium per slice. Cooking  Eat more home-cooked food and less restaurant, buffet, and fast food.  Avoid adding salt when cooking. Use salt-free seasonings or herbs instead of table salt or sea salt. Check with your health care provider or pharmacist before using salt substitutes.  Cook with plant-based oils, such as canola, sunflower, or olive oil. Meal planning  When eating at a restaurant, ask that your food be prepared with less salt or no salt, if possible.  Avoid foods that contain MSG (monosodium glutamate). MSG is sometimes added to [place] food, bouillon, and some canned foods. What foods are recommended? The items listed may not be a complete list. Talk with your dietitian about what  dietary choices are best for you. Grains Low-sodium cereals, including oats, puffed wheat and rice, and shredded wheat. Low-sodium crackers. Unsalted rice. Unsalted pasta. Low-sodium bread. Whole-grain breads and whole-grain pasta. Vegetables Fresh or frozen vegetables. "No salt added" canned vegetables. "No salt added" tomato sauce and paste. Low-sodium or reduced-sodium tomato and vegetable juice. Fruits Fresh, frozen, or canned fruit. Fruit juice. Meats and other  protein foods Fresh or frozen (no salt added) meat, poultry, seafood, and fish. Low-sodium canned tuna and salmon. Unsalted nuts. Dried peas, beans, and lentils without added salt. Unsalted canned beans. Eggs. Unsalted nut butters. Dairy Milk. Soy milk. Cheese that is naturally low in sodium, such as ricotta cheese, fresh mozzarella, or Swiss cheese Low-sodium or reduced-sodium cheese. Cream cheese. Yogurt. Fats and oils Unsalted butter. Unsalted margarine with no trans fat. Vegetable oils such as canola or olive oils. Seasonings and other foods Fresh and dried herbs and spices. Salt-free seasonings. Low-sodium mustard and ketchup. Sodium-free salad dressing. Sodium-free light mayonnaise. Fresh or refrigerated horseradish. Lemon juice. Vinegar. Homemade, reduced-sodium, or low-sodium soups. Unsalted popcorn and pretzels. Low-salt or salt-free chips. What foods are not recommended? The items listed may not be a complete list. Talk with your dietitian about what dietary choices are best for you. Grains Instant hot cereals. Bread stuffing, pancake, and biscuit mixes. Croutons. Seasoned rice or pasta mixes. Noodle soup cups. Boxed or frozen macaroni and cheese. Regular salted crackers. Self-rising flour. Vegetables Sauerkraut, pickled vegetables, and relishes. Olives. [place] fries. Onion rings. Regular canned vegetables (not low-sodium or reduced-sodium). Regular canned tomato sauce and paste (not low-sodium or reduced-sodium). Regular tomato and vegetable juice (not low-sodium or reduced-sodium). Frozen vegetables in sauces. Meats and other protein foods Meat or fish that is salted, canned, smoked, spiced, or pickled. Bacon, ham, sausage, hotdogs, corned beef, chipped beef, packaged lunch meats, salt pork, jerky, pickled herring, anchovies, regular canned tuna, sardines, salted nuts. Dairy Processed cheese and cheese spreads. Cheese curds. Blue cheese. Feta cheese. String cheese. Regular cottage  cheese. Buttermilk. Canned milk. Fats and oils Salted butter. Regular margarine. Ghee. Bacon fat. Seasonings and other foods Onion salt, garlic salt, seasoned salt, table salt, and sea salt. Canned and packaged gravies. Worcestershire sauce. Tartar sauce. Barbecue sauce. Teriyaki sauce. Soy sauce, including reduced-sodium. Steak sauce. Fish sauce. Oyster sauce. Cocktail sauce. Horseradish that you find on the shelf. Regular ketchup and mustard. Meat flavorings and tenderizers. Bouillon cubes. Hot sauce and Tabasco sauce. Premade or packaged marinades. Premade or packaged taco seasonings. Relishes. Regular salad dressings. Salsa. Potato and tortilla chips. Corn chips and puffs. Salted popcorn and pretzels. Canned or dried soups. Pizza. Frozen entrees and pot pies. Summary  Eating less sodium can help lower your blood pressure, reduce swelling, and protect your heart, liver, and kidneys.  Most people on this plan should limit their sodium intake to 1,500-2,000 mg (milligrams) of sodium each day.  Canned, boxed, and frozen foods are high in sodium. Restaurant foods, fast foods, and pizza are also very high in sodium. You also get sodium by adding salt to food.  Try to cook at home, eat more fresh fruits and vegetables, and eat less fast food, canned, processed, or prepared foods. This information is not intended to replace advice given to you by your health care provider. Make sure you discuss any questions you have with your health care provider. Document Released: 10/20/2001 Document Revised: 04/23/2016 Document Reviewed: 04/23/2016 Elsevier Interactive Patient Education  2018 [employer].  Fat and Cholesterol Restricted Diet Getting too  much fat and cholesterol in your diet may cause health problems. Following this diet helps keep your fat and cholesterol at normal levels. This can keep you from getting sick. What types of fat should I choose?  Choose monosaturated and polyunsaturated fats.  These are found in foods such as olive oil, canola oil, flaxseeds, walnuts, almonds, and seeds.  Eat more omega-3 fats. Good choices include salmon, mackerel, sardines, tuna, flaxseed oil, and ground flaxseeds.  Limit saturated fats. These are in animal products such as meats, butter, and cream. They can also be in plant products such as palm oil, palm kernel oil, and coconut oil.  Avoid foods with partially hydrogenated oils in them. These contain trans fats. Examples of foods that have trans fats are stick margarine, some tub margarines, cookies, crackers, and other baked goods. What general guidelines do I need to follow?  Check food labels. Look for the words "trans fat" and "saturated fat."  When preparing a meal: ? Fill half of your plate with vegetables and green salads. ? Fill one fourth of your plate with whole grains. Look for the word "whole" as the first word in the ingredient list. ? Fill one fourth of your plate with lean protein foods.  Eat more foods that have fiber, like apples, carrots, beans, peas, and barley.  Eat more home-cooked foods. Eat less at restaurants and buffets.  Limit or avoid alcohol.  Limit foods high in starch and sugar.  Limit fried foods.  Cook foods without frying them. Baking, boiling, grilling, and broiling are all great options.  Lose weight if you are overweight. Losing even a small amount of weight can help your overall health. It can also help prevent diseases such as diabetes and heart disease. What foods can I eat? Grains Whole grains, such as whole wheat or whole grain breads, crackers, cereals, and pasta. Unsweetened oatmeal, bulgur, barley, quinoa, or brown rice. Corn or whole wheat flour tortillas. Vegetables Fresh or frozen vegetables (raw, steamed, roasted, or grilled). Green salads. Fruits All fresh, canned (in natural juice), or frozen fruits. Meat and Other Protein Products Ground beef (85% or leaner), grass-fed beef, or  beef trimmed of fat. Skinless chicken or [place]. Ground chicken or [place]. Pork trimmed of fat. All fish and seafood. Eggs. Dried beans, peas, or lentils. Unsalted nuts or seeds. Unsalted canned or dry beans. Dairy Low-fat dairy products, such as skim or 1% milk, 2% or reduced-fat cheeses, low-fat ricotta or cottage cheese, or plain low-fat yogurt. Fats and Oils Tub margarines without trans fats. Light or reduced-fat mayonnaise and salad dressings. Avocado. Olive, canola, sesame, or safflower oils. Natural peanut or almond butter (choose ones without added sugar and oil). The items listed above may not be a complete list of recommended foods or beverages. Contact your dietitian for more options. What foods are not recommended? Grains White bread. White pasta. White rice. Cornbread. Bagels, pastries, and croissants. Crackers that contain trans fat. Vegetables White potatoes. Corn. Creamed or fried vegetables. Vegetables in a cheese sauce. Fruits Dried fruits. Canned fruit in light or heavy syrup. Fruit juice. Meat and Other Protein Products Fatty cuts of meat. Ribs, chicken wings, bacon, sausage, bologna, salami, chitterlings, fatback, hot dogs, bratwurst, and packaged luncheon meats. Liver and organ meats. Dairy Whole or 2% milk, cream, half-and-half, and cream cheese. Whole milk cheeses. Whole-fat or sweetened yogurt. Full-fat cheeses. Nondairy creamers and whipped toppings. Processed cheese, cheese spreads, or cheese curds. Sweets and Desserts Corn syrup, sugars, honey, and molasses. Candy.  Jam and jelly. Syrup. Sweetened cereals. Cookies, pies, cakes, donuts, muffins, and ice cream. Fats and Oils Butter, stick margarine, lard, shortening, ghee, or bacon fat. Coconut, palm kernel, or palm oils. Beverages Alcohol. Sweetened drinks (such as sodas, lemonade, and fruit drinks or punches). The items listed above may not be a complete list of foods and beverages to avoid. Contact your dietitian  for more information. This information is not intended to replace advice given to you by your health care provider. Make sure you discuss any questions you have with your health care provider. Document Released: 10/30/2011 Document Revised: 01/05/2016 Document Reviewed: 07/30/2013 Elsevier Interactive Patient Education  [employer]2018 [employer] Inc[employer]

## 2017-09-11 DIAGNOSIS — S43109A Unspecified dislocation of unspecified acromioclavicular joint, initial encounter: Secondary | ICD-10-CM

## 2017-09-11 HISTORY — DX: Unspecified dislocation of unspecified acromioclavicular joint, initial encounter: S43.109A

## 2017-09-16 ENCOUNTER — Emergency Department (HOSPITAL_COMMUNITY): Payer: 59

## 2017-09-16 ENCOUNTER — Emergency Department (HOSPITAL_COMMUNITY)
Admission: EM | Admit: 2017-09-16 | Discharge: 2017-09-16 | Disposition: A | Discharge status: Home / Self Care | Payer: 59 | Attending: Emergency Medicine | Admitting: Emergency Medicine

## 2017-09-16 ENCOUNTER — Encounter (HOSPITAL_COMMUNITY): Payer: Self-pay | Admitting: Radiology

## 2017-09-16 DIAGNOSIS — R51 Headache: Secondary | ICD-10-CM | POA: Insufficient documentation

## 2017-09-16 DIAGNOSIS — M25511 Pain in right shoulder: Secondary | ICD-10-CM | POA: Insufficient documentation

## 2017-09-16 DIAGNOSIS — Y9389 Activity, other specified: Secondary | ICD-10-CM | POA: Diagnosis not present

## 2017-09-16 DIAGNOSIS — T1490XA Injury, unspecified, initial encounter: Secondary | ICD-10-CM | POA: Insufficient documentation

## 2017-09-16 DIAGNOSIS — Y929 Unspecified place or not applicable: Secondary | ICD-10-CM | POA: Diagnosis not present

## 2017-09-16 DIAGNOSIS — S82301A Unspecified fracture of lower end of right tibia, initial encounter for closed fracture: Secondary | ICD-10-CM | POA: Insufficient documentation

## 2017-09-16 DIAGNOSIS — Y998 Other external cause status: Secondary | ICD-10-CM | POA: Diagnosis not present

## 2017-09-16 DIAGNOSIS — Z79899 Other long term (current) drug therapy: Secondary | ICD-10-CM | POA: Diagnosis not present

## 2017-09-16 DIAGNOSIS — S99911A Unspecified injury of right ankle, initial encounter: Secondary | ICD-10-CM | POA: Diagnosis present

## 2017-09-16 LAB — URINALYSIS, ROUTINE W REFLEX MICROSCOPIC
Bacteria, UA: NONE SEEN
Bilirubin Urine: NEGATIVE
Glucose, UA: NEGATIVE mg/dL
KETONES UR: NEGATIVE mg/dL
LEUKOCYTES UA: NEGATIVE
Nitrite: NEGATIVE
PH: 5 (ref 5.0–8.0)
Protein, ur: NEGATIVE mg/dL
Specific Gravity, Urine: >1.046 — ABNORMAL HIGH (ref 1.005–1.030)

## 2017-09-16 LAB — COMPREHENSIVE METABOLIC PANEL
ALT: 67 U/L — AB (ref 17–63)
ANION GAP: 11 (ref 5–15)
AST: 53 U/L — ABNORMAL HIGH (ref 15–41)
Albumin: 4.1 g/dL (ref 3.5–5.0)
Alkaline Phosphatase: 100 U/L (ref 38–126)
BUN: 13 mg/dL (ref 6–20)
CALCIUM: 8.8 mg/dL — AB (ref 8.9–10.3)
CO2: 21 mmol/L — AB (ref 22–32)
Chloride: 106 mmol/L (ref 101–111)
Creatinine, Ser: 1.11 mg/dL (ref 0.61–1.24)
Glucose, Bld: 140 mg/dL — ABNORMAL HIGH (ref 65–99)
Potassium: 5 mmol/L (ref 3.5–5.1)
SODIUM: 138 mmol/L (ref 135–145)
TOTAL PROTEIN: 6.8 g/dL (ref 6.5–8.1)
Total Bilirubin: 1.4 mg/dL — ABNORMAL HIGH (ref 0.3–1.2)

## 2017-09-16 LAB — CBC
HCT: 40.3 % (ref 39.0–52.0)
Hemoglobin: 14.3 g/dL (ref 13.0–17.0)
MCH: 30.6 pg (ref 26.0–34.0)
MCHC: 35.5 g/dL (ref 30.0–36.0)
MCV: 86.1 fL (ref 78.0–100.0)
PLATELETS: 238 10*3/uL (ref 150–400)
RBC: 4.68 MIL/uL (ref 4.22–5.81)
RDW: 12.7 % (ref 11.5–15.5)
WBC: 10.4 10*3/uL (ref 4.0–10.5)

## 2017-09-16 LAB — PROTIME-INR
INR: 1.06
PROTHROMBIN TIME: 13.7 s (ref 11.4–15.2)

## 2017-09-16 LAB — I-STAT CHEM 8, ED
BUN: 14 mg/dL (ref 6–20)
CALCIUM ION: 1.04 mmol/L — AB (ref 1.15–1.40)
CREATININE: 0.9 mg/dL (ref 0.61–1.24)
Chloride: 106 mmol/L (ref 101–111)
GLUCOSE: 138 mg/dL — AB (ref 65–99)
HCT: 40 % (ref 39.0–52.0)
HEMOGLOBIN: 13.6 g/dL (ref 13.0–17.0)
POTASSIUM: 4.8 mmol/L (ref 3.5–5.1)
Sodium: 138 mmol/L (ref 135–145)
TCO2: 22 mmol/L (ref 22–32)

## 2017-09-16 LAB — SAMPLE TO BLOOD BANK

## 2017-09-16 LAB — ETHANOL

## 2017-09-16 LAB — I-STAT CG4 LACTIC ACID, ED: Lactic Acid, Venous: 2.76 mmol/L (ref 0.5–1.9)

## 2017-09-16 LAB — CDS SEROLOGY

## 2017-09-16 MED ORDER — IBUPROFEN 400 MG PO TABS
400.0000 mg | ORAL_TABLET | Freq: Once | ORAL | Status: AC
  Administered 2017-09-16: 400 mg via ORAL
  Filled 2017-09-16: qty 1

## 2017-09-16 MED ORDER — IOHEXOL 300 MG/ML  SOLN
100.0000 mL | Freq: Once | INTRAMUSCULAR | Status: AC | PRN
  Administered 2017-09-16: 100 mL via INTRAVENOUS
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
## 2017-09-16 NOTE — ED Notes (Signed)
Patient Alert and oriented to baseline. Stable and ambulatory to baseline. Patient verbalized understanding of the discharge instructions.  Patient belongings were taken by the patient.   

## 2017-09-17 ENCOUNTER — Encounter: Payer: Self-pay | Admitting: Family Medicine

## 2017-10-08 ENCOUNTER — Encounter: Payer: Self-pay | Admitting: Family Medicine

## 2017-10-09 ENCOUNTER — Telehealth: Payer: Self-pay | Admitting: Internal Medicine

## 2017-10-09 NOTE — Telephone Encounter (Signed)
Pt went to Dr. Sharolyn Douglas office today [facility]. He was already advised that we could not do a referral and can not back date it so he paid out of pocket for that visit. I will call [facility] @ [phone] to let them know as well

## 2017-10-10 NOTE — Telephone Encounter (Signed)
[facility] was notified

## 2017-11-06 ENCOUNTER — Encounter: Payer: Self-pay | Admitting: Family Medicine

## 2017-11-20 ENCOUNTER — Other Ambulatory Visit: Payer: 59

## 2017-11-20 DIAGNOSIS — Z5181 Encounter for therapeutic drug level monitoring: Secondary | ICD-10-CM

## 2017-11-20 DIAGNOSIS — R7301 Impaired fasting glucose: Secondary | ICD-10-CM

## 2017-11-20 DIAGNOSIS — E782 Mixed hyperlipidemia: Secondary | ICD-10-CM

## 2017-11-20 DIAGNOSIS — R635 Abnormal weight gain: Secondary | ICD-10-CM

## 2017-11-20 NOTE — Addendum Note (Signed)
Addended by: Herminio CommonsJOHNSON, Chadwick Reiswig A on: 11/20/2017 08:38 AM   Modules accepted: Orders

## 2017-11-21 LAB — LIPID PANEL
CHOL/HDL RATIO: 7.6 ratio — AB (ref 0.0–5.0)
CHOLESTEROL TOTAL: 197 mg/dL (ref 100–199)
HDL: 26 mg/dL — AB (ref >39)
TRIGLYCERIDES: 412 mg/dL — AB (ref 0–149)

## 2017-11-21 LAB — COMPREHENSIVE METABOLIC PANEL
A/G RATIO: 1.8 (ref 1.2–2.2)
ALT: 55 IU/L — AB (ref 0–44)
AST: 22 IU/L (ref 0–40)
Albumin: 4.4 g/dL (ref 3.5–5.5)
Alkaline Phosphatase: 171 IU/L — ABNORMAL HIGH (ref 39–117)
BUN/Creatinine Ratio: 10 (ref 9–20)
BUN: 10 mg/dL (ref 6–24)
Bilirubin Total: 0.5 mg/dL (ref 0.0–1.2)
CALCIUM: 9.4 mg/dL (ref 8.7–10.2)
CHLORIDE: 103 mmol/L (ref 96–106)
CO2: 22 mmol/L (ref 20–29)
Creatinine, Ser: 1.03 mg/dL (ref 0.76–1.27)
GFR, EST AFRICAN AMERICAN: 100 mL/min/{1.73_m2} (ref >59)
GFR, EST NON AFRICAN AMERICAN: 86 mL/min/{1.73_m2} (ref >59)
GLOBULIN, TOTAL: 2.5 g/dL (ref 1.5–4.5)
Glucose: 111 mg/dL — ABNORMAL HIGH (ref 65–99)
Potassium: 4.6 mmol/L (ref 3.5–5.2)
SODIUM: 141 mmol/L (ref 134–144)
TOTAL PROTEIN: 6.9 g/dL (ref 6.0–8.5)

## 2017-11-21 LAB — CBC WITH DIFFERENTIAL/PLATELET
BASOS ABS: 0 10*3/uL (ref 0.0–0.2)
Basos: 0 %
EOS (ABSOLUTE): 0.2 10*3/uL (ref 0.0–0.4)
Eos: 3 %
HEMATOCRIT: 42.3 % (ref 37.5–51.0)
HEMOGLOBIN: 14.6 g/dL (ref 13.0–17.7)
IMMATURE GRANS (ABS): 0 10*3/uL (ref 0.0–0.1)
IMMATURE GRANULOCYTES: 0 %
LYMPHS: 32 %
Lymphocytes Absolute: 1.8 10*3/uL (ref 0.7–3.1)
MCH: 30.4 pg (ref 26.6–33.0)
MCHC: 34.5 g/dL (ref 31.5–35.7)
MCV: 88 fL (ref 79–97)
MONOCYTES: 7 %
Monocytes Absolute: 0.4 10*3/uL (ref 0.1–0.9)
NEUTROS PCT: 58 %
Neutrophils Absolute: 3.2 10*3/uL (ref 1.4–7.0)
Platelets: 228 10*3/uL (ref 150–450)
RBC: 4.81 x10E6/uL (ref 4.14–5.80)
RDW: 13.9 % (ref 12.3–15.4)
WBC: 5.6 10*3/uL (ref 3.4–10.8)

## 2017-11-21 LAB — TSH: TSH: 1.44 u[IU]/mL (ref 0.450–4.500)

## 2017-11-21 LAB — HEMOGLOBIN A1C
ESTIMATED AVERAGE GLUCOSE: 108 mg/dL
Hgb A1c MFr Bld: 5.4 % (ref 4.8–5.6)
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
## 2017-11-25 ENCOUNTER — Encounter: Payer: Self-pay | Admitting: Family Medicine

## 2017-11-25 ENCOUNTER — Ambulatory Visit (INDEPENDENT_AMBULATORY_CARE_PROVIDER_SITE_OTHER): Payer: 59 | Admitting: Family Medicine

## 2017-11-25 VITALS — BP 124/86 | HR 84 | Ht 73.0 in | Wt 212.8 lb

## 2017-11-25 DIAGNOSIS — Z0000 Encounter for general adult medical examination without abnormal findings: Secondary | ICD-10-CM | POA: Diagnosis not present

## 2017-11-25 DIAGNOSIS — S43101D Unspecified dislocation of right acromioclavicular joint, subsequent encounter: Secondary | ICD-10-CM

## 2017-11-25 DIAGNOSIS — E559 Vitamin D deficiency, unspecified: Secondary | ICD-10-CM

## 2017-11-25 DIAGNOSIS — E782 Mixed hyperlipidemia: Secondary | ICD-10-CM

## 2017-11-25 DIAGNOSIS — R7301 Impaired fasting glucose: Secondary | ICD-10-CM | POA: Diagnosis not present

## 2017-11-25 DIAGNOSIS — Z5181 Encounter for therapeutic drug level monitoring: Secondary | ICD-10-CM

## 2017-11-25 MED ORDER — FISH OIL 1200 MG PO CAPS: 3.0000 | ORAL_CAPSULE | Freq: Every day | ORAL | Status: DC

## 2017-11-25 MED ORDER — ATORVASTATIN CALCIUM 20 MG PO TABS: 20.0000 mg | ORAL_TABLET | Freq: Every day | ORAL | 0 refills | Status: DC

## 2017-11-25 NOTE — Patient Instructions (Addendum)
HEALTH MAINTENANCE RECOMMENDATIONS:  It is recommended that you get at least 30 minutes of aerobic exercise at least 5 days/week (for weight loss, you may need as much as 60-90 minutes). This can be any activity that gets your heart rate up. This can be divided in 10-15 minute intervals if needed, but try and build up your endurance at least once a week.  Weight bearing exercise is also recommended twice weekly.  Eat a healthy diet with lots of vegetables, fruits and fiber.  "Colorful" foods have a lot of vitamins (ie green vegetables, tomatoes, red peppers, etc).  Limit sweet tea, regular sodas and alcoholic beverages, all of which has a lot of calories and sugar.  Up to 2 alcoholic drinks daily may be beneficial for men (unless trying to lose weight, watch sugars).  Drink a lot of water.  Sunscreen of at least SPF 30 should be used on all sun-exposed parts of the skin when outside between the hours of 10 am and 4 pm (not just when at beach or pool, but even with exercise, golf, tennis, and yard work!)  Use a sunscreen that says "broad spectrum" so it covers both UVA and UVB rays, and make sure to reapply every 1-2 hours.  Remember to change the batteries in your smoke detectors when changing your clock times in the spring and fall.  Use your seat belt every time you are in a car, and please drive safely and not be distracted with cell phones and texting while driving.  Your fasting sugar remains pre-diabetic and your Triglycerides were very high today.  Restart the atorvastatin and fish oil 3000mg  daily. Return for fasting labs in 6-8 weeks to recheck while back on recommended medications.  May need to make further changes based on those results (rather than on today's when you had been out of these medications). Continue to try and limit sweets, sugar and carbs in your diet (limit portions and frequency). Try and eat more fruits and vegetables.   Your AC separation is type V (5)--that might  be helpful for you to know when doing your research on the procedures  Acromioclavicular Separation Acromioclavicular separation is an injury to the small joint at the top of the shoulder (acromioclavicular joint or AC joint). The [facility] joint connects the outer tip of the collarbone (clavicle) to the top of the shoulder blade (acromion). Two strong cords of tissue (acromioclavicular ligament and coracoclavicular ligament) stretch across the [facility] joint to keep it in place. An AC joint separation happens when one or both ligaments stretch or tear, causing the joint to separate. There are six types of separation. The type of separation that you have depends on how much the ligaments are damaged and how far the joint has moved out of place. The less severe types of separation are most common. What are the causes? Common causes of this condition include:  A hard, direct hit (blow) to the top of the shoulder.  Falling on the shoulder.  Falling on an outstretched arm.  What increases the risk? This condition is more likely to develop in male athletes under age 30 who participate in sports that involve potential contact, such as:  Football.  Rugby.  Hockey.  Cycling.  Martial arts.  What are the signs or symptoms?  The main symptom of this condition is shoulder pain. Pain may be mild or severe, depending on the type of separation. Other signs and symptoms in the shoulder may include:  Swelling.  Limited  range of motion, especially when moving the arm across the body.  Pain and tenderness when touching the top of the shoulder.  Pain when putting weight on the shoulder, such as rolling on the shoulder while sleeping.  A visible bump (deformity) over the joint.  A clicking or popping sound when moving the shoulder.  How is this diagnosed? This condition may be diagnosed based on:  Your symptoms.  Your medical history, including your history of recent injuries.  A physical exam to  check for deformity and limited range of motion.  Imaging tests, such as: ? X-rays. ? MRI. ? Ultrasound.  How is this treated? Treatment for this condition may include:  Resting the shoulder before gradually returning to normal activities.  Icing the shoulder.  NSAIDs to help reduce pain and swelling.  A sling to support your shoulder and keep it from moving.  Physical therapy.  Surgery. This is rare. Surgery may be needed for severe injuries that include breaks (fractures) in a bone, or injuries that do not get better with nonsurgical treatments. Surgery is followed by keeping your joint in place for a period of time (immobilization) and physical therapy.  Follow these instructions at home: If you have a sling:  Wear the sling as told by your health care provider. Remove it only as told by your health care provider.  Reposition the sling if your fingers tingle, become numb, or turn cold and blue.  Do not let your sling get wet if it is not waterproof. Ask your health care provider if you can remove the sling for bathing and showering.  Keep the sling clean. Managing pain, stiffness, and swelling   If directed, apply ice to the injured area. ? Put ice in a plastic bag. ? Place a towel between your skin and the bag. ? Leave the ice on for 20 minutes, 2-3 times a day.  Move your fingers often to avoid stiffness and to lessen swelling. Driving  Do not drive or operate heavy machinery while taking prescription pain medicine.  Ask your health care provider when it is safe for you to drive. Activity  Rest and return to your normal activities as told by your health care provider. Ask your health care provider what activities are safe for you.  Do exercises as told by your health care provider. General instructions  Do not use any tobacco products, such as cigarettes, chewing tobacco, and e-cigarettes. Tobacco can delay healing. If you need help quitting, ask your health  care provider.  Take over-the-counter and prescription medicines only as told by your health care provider.  Keep all follow-up visits as told by your health care provider. This is important. How is this prevented?  Make sure to use equipment that fits you.  Wear shoulder padding during contact sports.  Be safe and responsible while being active to avoid falls. Contact a health care provider if:  Your pain and stiffness do not improve after 2 weeks. This information is not intended to replace advice given to you by your health care provider. Make sure you discuss any questions you have with your health care provider. Document Released: 04/30/2005 Document Revised: 01/05/2016 Document Reviewed: 04/02/2015 Elsevier Interactive Patient Education  [employer]2018 [employer]

## 2018-01-16 ENCOUNTER — Encounter: Payer: Self-pay | Admitting: Family Medicine

## 2018-01-20 ENCOUNTER — Other Ambulatory Visit: Payer: 59

## 2018-01-20 DIAGNOSIS — R7301 Impaired fasting glucose: Secondary | ICD-10-CM

## 2018-01-20 DIAGNOSIS — E782 Mixed hyperlipidemia: Secondary | ICD-10-CM

## 2018-01-20 DIAGNOSIS — Z5181 Encounter for therapeutic drug level monitoring: Secondary | ICD-10-CM

## 2018-01-21 LAB — HEPATIC FUNCTION PANEL
ALK PHOS: 131 IU/L — AB (ref 39–117)
ALT: 55 IU/L — ABNORMAL HIGH (ref 0–44)
AST: 25 IU/L (ref 0–40)
Albumin: 4.6 g/dL (ref 3.5–5.5)
Bilirubin Total: 0.6 mg/dL (ref 0.0–1.2)
Bilirubin, Direct: 0.17 mg/dL (ref 0.00–0.40)
TOTAL PROTEIN: 6.8 g/dL (ref 6.0–8.5)

## 2018-01-21 LAB — LIPID PANEL
CHOLESTEROL TOTAL: 133 mg/dL (ref 100–199)
Chol/HDL Ratio: 4.4 ratio (ref 0.0–5.0)
HDL: 30 mg/dL — AB (ref >39)
LDL CALC: 62 mg/dL (ref 0–99)
TRIGLYCERIDES: 206 mg/dL — AB (ref 0–149)
VLDL Cholesterol Cal: 41 mg/dL — ABNORMAL HIGH (ref 5–40)

## 2018-01-21 LAB — GLUCOSE, RANDOM: Glucose: 107 mg/dL — ABNORMAL HIGH (ref 65–99)

## 2018-02-03 HISTORY — PX: RECONSTRUCTION OF CORACOCLAVICULAR LIGAMENT: SHX6045

## 2018-02-12 ENCOUNTER — Encounter: Payer: Self-pay | Admitting: Family Medicine

## 2018-02-12 ENCOUNTER — Other Ambulatory Visit: Payer: Self-pay | Admitting: *Deleted

## 2018-02-12 DIAGNOSIS — E782 Mixed hyperlipidemia: Secondary | ICD-10-CM

## 2018-02-12 MED ORDER — ATORVASTATIN CALCIUM 20 MG PO TABS: 20.0000 mg | ORAL_TABLET | Freq: Every day | ORAL | 0 refills | Status: DC

## 2018-02-13 ENCOUNTER — Encounter: Payer: Self-pay | Admitting: *Deleted

## 2018-04-20 ENCOUNTER — Encounter: Payer: Self-pay | Admitting: Family Medicine

## 2018-05-15 ENCOUNTER — Telehealth: Payer: Self-pay

## 2018-05-15 DIAGNOSIS — E782 Mixed hyperlipidemia: Secondary | ICD-10-CM

## 2018-05-15 MED ORDER — ATORVASTATIN CALCIUM 20 MG PO TABS: 20.0000 mg | ORAL_TABLET | Freq: Every day | ORAL | 0 refills | Status: DC

## 2018-05-15 NOTE — Telephone Encounter (Signed)
Done

## 2018-05-15 NOTE — Telephone Encounter (Signed)
Patient has called to request a refill on Atorvastatin.

## 2018-05-20 ENCOUNTER — Encounter: Payer: Self-pay | Admitting: Family Medicine

## 2018-05-20 DIAGNOSIS — E782 Mixed hyperlipidemia: Secondary | ICD-10-CM

## 2018-05-20 MED ORDER — ATORVASTATIN CALCIUM 20 MG PO TABS: 20.0000 mg | ORAL_TABLET | Freq: Every day | ORAL | 0 refills | Status: DC

## 2018-05-27 NOTE — Progress Notes (Signed)
Chief Complaint  Patient presents with  . Hyperlipidemia    fasting med check. No concerns.     John Frazier is a 48 y.o. male who presents for 6 month follow-up. He is getting over a cold, mostly in his chest.  Never had fever.  Slight residual cough.  Dyslipidemia--He has been on atorvastatin since 02/2016. He reports compliance with medication, and denies side effects. He is currently taking only 1 fish oil capsule daily (very large). He reports trying to follow a lowfat, low cholesterol diet.  (still cooking more, eating out less, using air-fryer)  (prior hx includes 12/2015 starting low dose fenofibrate along with OTC Niacin; changed due to higher LDL).   His lipids were high at his physical in July--had run out of meds, and diet was worse:  CHOL 197 11/20/2017   HDL 26 (L) 11/20/2017   LDLCALC Comment 11/20/2017   TRIG 412 (H) 11/20/2017   CHOLHDL 7.6 (H) 11/20/2017   It was repeated a couple of months later (no missed doses, improved diet): Lab Results  Component Value Date   CHOL 133 01/20/2018   HDL 30 (L) 01/20/2018   LDLCALC 62 01/20/2018   TRIG 206 (H) 01/20/2018   CHOLHDL 4.4 01/20/2018    Impaired fasting glucose.In January, 2019 his fasting glucose was 112, A1c as 5.7.  His diet and A1c had improved at his physical in July. Last fasting sugar was 109 in September.  Lab Results  Component Value Date   HGBA1C 5.4 11/20/2017   Exercise:  Walking more regularly over the last month (prior to that was more sporadic).  Running around with the dogs outside daily also.  Vitamin D deficiency: Treated in past with rx. Currently taking 2000 IU daily.Level was normal in July 2018 at 37.  PMH, PSH, SH reviewed  Outpatient Encounter Medications as of 05/28/2018  Medication Sig  . atorvastatin (LIPITOR) 20 MG tablet Take 1 tablet (20 mg total) by mouth daily.  . Cholecalciferol (VITAMIN D) 2000 units tablet Take 2,000 Units by mouth daily.  . Omega-3 Fatty Acids  (FISH OIL) 1200 MG CAPS Take 3 capsules (3,600 mg total) by mouth daily. (Patient taking differently: Take 1 capsule by mouth daily. )   No facility-administered encounter medications on file as of 05/28/2018.    No Known Allergies  ROS:  No headaches, dizziness, fever, chills, chest pain, shortness of breath, GI or GU complaints, bleeding, bruising, rash.  Residual cough s/p recent URI symptoms.  Denies shoulder pain, but has some limitations in his range.   PHYSICAL EXAM:  BP 130/88   Pulse 84   Ht 6\' 1"  (1.854 m)   Wt 214 lb 6.4 oz (97.3 kg)   BMI 28.29 kg/m   132/84 on repeat by MD  Pleasant, well-appearing male in good spirits, in no distress HEENT: EOMI, conjunctiva and sclera are clear. OP clear, no sinus tenderness Neck: no lymphadenopathy, thyromegaly or mass Heart: regular rate and rhythm Lungs: clear bilaterally Abdomen: soft, nontender, no mass Extremities: no edema, normal pulses Psych: normal mood, affect, hygiene and grooming Neuro: alert and oriented, cranial nerves intact, normal gait    ASSESSMENT/PLAN:  Mixed dyslipidemia - check lipids. suspect TG may be high (decreased fish oil dose). If very high, consider higher statin dose; will try different fish oil formulaiton higher dose - Plan: Comprehensive metabolic panel, Lipid panel  Impaired fasting glucose - encouraged continued daily exercise, limiting sugar and carbs in diet, and some weight loss - Plan: Hemoglobin  A1c, Comprehensive metabolic panel  F/u in July as scheduled.

## 2018-05-28 ENCOUNTER — Ambulatory Visit: Payer: PRIVATE HEALTH INSURANCE | Admitting: Family Medicine

## 2018-05-28 ENCOUNTER — Encounter: Payer: Self-pay | Admitting: Family Medicine

## 2018-05-28 VITALS — BP 130/88 | HR 84 | Ht 73.0 in | Wt 214.4 lb

## 2018-05-28 DIAGNOSIS — R7301 Impaired fasting glucose: Secondary | ICD-10-CM | POA: Diagnosis not present

## 2018-05-28 DIAGNOSIS — E782 Mixed hyperlipidemia: Secondary | ICD-10-CM | POA: Diagnosis not present

## 2018-05-28 NOTE — Patient Instructions (Signed)
Try and look for different formulations of omega-3 fish oil that might be easier to swallow (ie gummies). Try and take 3000mg  daily, and continue lowfat, low cholesterol diet. Continue to try and exercise daily (to help keep the HDL up).

## 2018-05-29 LAB — LIPID PANEL
CHOL/HDL RATIO: 5 ratio (ref 0.0–5.0)
Cholesterol, Total: 121 mg/dL (ref 100–199)
HDL: 24 mg/dL — AB (ref >39)
LDL CALC: 58 mg/dL (ref 0–99)
Triglycerides: 195 mg/dL — ABNORMAL HIGH (ref 0–149)
VLDL CHOLESTEROL CAL: 39 mg/dL (ref 5–40)

## 2018-05-29 LAB — COMPREHENSIVE METABOLIC PANEL
ALT: 85 IU/L — AB (ref 0–44)
AST: 34 IU/L (ref 0–40)
Albumin/Globulin Ratio: 1.7 (ref 1.2–2.2)
Albumin: 4.5 g/dL (ref 3.5–5.5)
Alkaline Phosphatase: 156 IU/L — ABNORMAL HIGH (ref 39–117)
BILIRUBIN TOTAL: 0.3 mg/dL (ref 0.0–1.2)
BUN / CREAT RATIO: 11 (ref 9–20)
BUN: 11 mg/dL (ref 6–24)
CALCIUM: 9.5 mg/dL (ref 8.7–10.2)
CHLORIDE: 107 mmol/L — AB (ref 96–106)
CO2: 22 mmol/L (ref 20–29)
CREATININE: 0.98 mg/dL (ref 0.76–1.27)
GFR, EST AFRICAN AMERICAN: 106 mL/min/{1.73_m2} (ref >59)
GFR, EST NON AFRICAN AMERICAN: 91 mL/min/{1.73_m2} (ref >59)
GLUCOSE: 106 mg/dL — AB (ref 65–99)
Globulin, Total: 2.7 g/dL (ref 1.5–4.5)
Potassium: 5.1 mmol/L (ref 3.5–5.2)
Sodium: 145 mmol/L — ABNORMAL HIGH (ref 134–144)
TOTAL PROTEIN: 7.2 g/dL (ref 6.0–8.5)

## 2018-05-29 LAB — HEMOGLOBIN A1C
ESTIMATED AVERAGE GLUCOSE: 117 mg/dL
HEMOGLOBIN A1C: 5.7 % — AB (ref 4.8–5.6)

## 2018-07-26 ENCOUNTER — Other Ambulatory Visit: Payer: Self-pay | Admitting: Family Medicine

## 2018-07-26 DIAGNOSIS — E782 Mixed hyperlipidemia: Secondary | ICD-10-CM

## 2018-10-30 ENCOUNTER — Other Ambulatory Visit: Payer: Self-pay | Admitting: Family Medicine

## 2018-10-30 DIAGNOSIS — E782 Mixed hyperlipidemia: Secondary | ICD-10-CM

## 2018-11-23 NOTE — Patient Instructions (Addendum)
  HEALTH MAINTENANCE RECOMMENDATIONS:  It is recommended that you get at least 30 minutes of aerobic exercise at least 5 days/week (for weight loss, you may need as much as 60-90 minutes). This can be any activity that gets your heart rate up. This can be divided in 10-15 minute intervals if needed, but try and build up your endurance at least once a week.  Weight bearing exercise is also recommended twice weekly.  Eat a healthy diet with lots of vegetables, fruits and fiber.  "Colorful" foods have a lot of vitamins (ie green vegetables, tomatoes, red peppers, etc).  Limit sweet tea, regular sodas and alcoholic beverages, all of which has a lot of calories and sugar.  Up to 2 alcoholic drinks daily may be beneficial for men (unless trying to lose weight, watch sugars).  Drink a lot of water.  Sunscreen of at least SPF 30 should be used on all sun-exposed parts of the skin when outside between the hours of 10 am and 4 pm (not just when at beach or pool, but even with exercise, golf, tennis, and yard work!)  Use a sunscreen that says "broad spectrum" so it covers both UVA and UVB rays, and make sure to reapply every 1-2 hours.  Remember to change the batteries in your smoke detectors when changing your clock times in the spring and fall. Carbon monoxide detectors are recommended for your home.  Use your seat belt every time you are in a car, and please drive safely and not be distracted with cell phones and texting while driving.   We discussed increasing your omega-3 fish oil to 3/day (if possible), as well as continuing lowfat, low cholesterol diet.  Options if your numbers and ratio don't improve on the next check include either increasing the atorvastatin to 40mg  or trying a different generic statin, Rosuvastatin (crestor) 20 mg.  Please get a flu shot in the Fall!
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
## 2018-11-24 ENCOUNTER — Other Ambulatory Visit: Payer: Self-pay

## 2018-11-24 ENCOUNTER — Other Ambulatory Visit: Payer: PRIVATE HEALTH INSURANCE

## 2018-11-24 ENCOUNTER — Telehealth: Payer: Self-pay | Admitting: *Deleted

## 2018-11-24 DIAGNOSIS — E782 Mixed hyperlipidemia: Secondary | ICD-10-CM

## 2018-11-24 DIAGNOSIS — Z0000 Encounter for general adult medical examination without abnormal findings: Secondary | ICD-10-CM

## 2018-11-24 DIAGNOSIS — R7301 Impaired fasting glucose: Secondary | ICD-10-CM

## 2018-11-24 DIAGNOSIS — Z5181 Encounter for therapeutic drug level monitoring: Secondary | ICD-10-CM

## 2018-11-24 NOTE — Telephone Encounter (Signed)
Patient was here for labs for CPE this week. Need orders please, thanks.

## 2018-11-25 LAB — CBC WITH DIFFERENTIAL/PLATELET
Basophils Absolute: 0 10*3/uL (ref 0.0–0.2)
Basos: 0 %
EOS (ABSOLUTE): 0.1 10*3/uL (ref 0.0–0.4)
Eos: 2 %
Hematocrit: 42.5 % (ref 37.5–51.0)
Hemoglobin: 15.2 g/dL (ref 13.0–17.7)
Immature Grans (Abs): 0 10*3/uL (ref 0.0–0.1)
Immature Granulocytes: 0 %
Lymphocytes Absolute: 1.8 10*3/uL (ref 0.7–3.1)
Lymphs: 31 %
MCH: 31.3 pg (ref 26.6–33.0)
MCHC: 35.8 g/dL — ABNORMAL HIGH (ref 31.5–35.7)
MCV: 87 fL (ref 79–97)
Monocytes Absolute: 0.4 10*3/uL (ref 0.1–0.9)
Monocytes: 7 %
Neutrophils Absolute: 3.5 10*3/uL (ref 1.4–7.0)
Neutrophils: 60 %
Platelets: 204 10*3/uL (ref 150–450)
RBC: 4.86 x10E6/uL (ref 4.14–5.80)
RDW: 13.3 % (ref 11.6–15.4)
WBC: 5.9 10*3/uL (ref 3.4–10.8)

## 2018-11-25 LAB — COMPREHENSIVE METABOLIC PANEL
ALT: 63 IU/L — ABNORMAL HIGH (ref 0–44)
AST: 28 IU/L (ref 0–40)
Albumin/Globulin Ratio: 2 (ref 1.2–2.2)
Albumin: 4.7 g/dL (ref 4.0–5.0)
Alkaline Phosphatase: 121 IU/L — ABNORMAL HIGH (ref 39–117)
BUN/Creatinine Ratio: 12 (ref 9–20)
BUN: 12 mg/dL (ref 6–24)
Bilirubin Total: 0.6 mg/dL (ref 0.0–1.2)
CO2: 21 mmol/L (ref 20–29)
Calcium: 9.4 mg/dL (ref 8.7–10.2)
Chloride: 105 mmol/L (ref 96–106)
Creatinine, Ser: 0.97 mg/dL (ref 0.76–1.27)
GFR calc Af Amer: 106 mL/min/{1.73_m2} (ref >59)
GFR calc non Af Amer: 92 mL/min/{1.73_m2} (ref >59)
Globulin, Total: 2.4 g/dL (ref 1.5–4.5)
Glucose: 109 mg/dL — ABNORMAL HIGH (ref 65–99)
Potassium: 4.3 mmol/L (ref 3.5–5.2)
Sodium: 141 mmol/L (ref 134–144)
Total Protein: 7.1 g/dL (ref 6.0–8.5)

## 2018-11-25 LAB — LIPID PANEL
Chol/HDL Ratio: 4.6 ratio (ref 0.0–5.0)
Cholesterol, Total: 148 mg/dL (ref 100–199)
HDL: 32 mg/dL — ABNORMAL LOW (ref >39)
LDL Calculated: 76 mg/dL (ref 0–99)
Triglycerides: 202 mg/dL — ABNORMAL HIGH (ref 0–149)
VLDL Cholesterol Cal: 40 mg/dL (ref 5–40)

## 2018-11-25 LAB — HEMOGLOBIN A1C
Est. average glucose Bld gHb Est-mCnc: 114 mg/dL
Hgb A1c MFr Bld: 5.6 % (ref 4.8–5.6)

## 2018-11-26 ENCOUNTER — Encounter: Payer: Self-pay | Admitting: Family Medicine

## 2018-11-26 ENCOUNTER — Ambulatory Visit (INDEPENDENT_AMBULATORY_CARE_PROVIDER_SITE_OTHER): Payer: No Typology Code available for payment source | Admitting: Family Medicine

## 2018-11-26 ENCOUNTER — Other Ambulatory Visit: Payer: Self-pay

## 2018-11-26 VITALS — BP 128/74 | HR 80 | Temp 98.7°F | Ht 73.0 in | Wt 209.8 lb

## 2018-11-26 DIAGNOSIS — R7989 Other specified abnormal findings of blood chemistry: Secondary | ICD-10-CM

## 2018-11-26 DIAGNOSIS — E559 Vitamin D deficiency, unspecified: Secondary | ICD-10-CM

## 2018-11-26 DIAGNOSIS — Z5181 Encounter for therapeutic drug level monitoring: Secondary | ICD-10-CM

## 2018-11-26 DIAGNOSIS — Z0000 Encounter for general adult medical examination without abnormal findings: Secondary | ICD-10-CM

## 2018-11-26 DIAGNOSIS — E782 Mixed hyperlipidemia: Secondary | ICD-10-CM

## 2018-11-26 DIAGNOSIS — R945 Abnormal results of liver function studies: Secondary | ICD-10-CM

## 2018-11-26 DIAGNOSIS — R7301 Impaired fasting glucose: Secondary | ICD-10-CM | POA: Diagnosis not present

## 2019-01-15 ENCOUNTER — Other Ambulatory Visit: Payer: Self-pay | Admitting: Family Medicine

## 2019-01-15 DIAGNOSIS — E782 Mixed hyperlipidemia: Secondary | ICD-10-CM

## 2019-03-24 ENCOUNTER — Other Ambulatory Visit: Payer: Self-pay | Admitting: Family Medicine

## 2019-03-24 DIAGNOSIS — E782 Mixed hyperlipidemia: Secondary | ICD-10-CM

## 2019-05-27 ENCOUNTER — Other Ambulatory Visit: Payer: No Typology Code available for payment source

## 2019-05-27 ENCOUNTER — Telehealth: Payer: Self-pay

## 2019-05-27 ENCOUNTER — Other Ambulatory Visit: Payer: Self-pay

## 2019-05-27 DIAGNOSIS — R7989 Other specified abnormal findings of blood chemistry: Secondary | ICD-10-CM

## 2019-05-27 DIAGNOSIS — Z5181 Encounter for therapeutic drug level monitoring: Secondary | ICD-10-CM

## 2019-05-27 DIAGNOSIS — R7301 Impaired fasting glucose: Secondary | ICD-10-CM

## 2019-05-27 DIAGNOSIS — E782 Mixed hyperlipidemia: Secondary | ICD-10-CM

## 2019-05-27 NOTE — Telephone Encounter (Signed)
Pt. Came in for blood work and BP his BP was 130/84.

## 2019-05-27 NOTE — Telephone Encounter (Signed)
Thanks.  I believe he was supposed to have full vitals (including weight) for his virtual visit next week.  I think V usually saves these on a sticky so that she can enter the vitals on his appointment.  Check with her about it.  Thanks

## 2019-05-28 LAB — LIPID PANEL
Chol/HDL Ratio: 4.6 ratio (ref 0.0–5.0)
Cholesterol, Total: 123 mg/dL (ref 100–199)
HDL: 27 mg/dL — ABNORMAL LOW (ref >39)
LDL Chol Calc (NIH): 66 mg/dL (ref 0–99)
Triglycerides: 177 mg/dL — ABNORMAL HIGH (ref 0–149)
VLDL Cholesterol Cal: 30 mg/dL (ref 5–40)

## 2019-05-28 LAB — HEPATIC FUNCTION PANEL
ALT: 66 IU/L — ABNORMAL HIGH (ref 0–44)
AST: 23 IU/L (ref 0–40)
Albumin: 4.4 g/dL (ref 4.0–5.0)
Alkaline Phosphatase: 128 IU/L — ABNORMAL HIGH (ref 39–117)
Bilirubin Total: 0.7 mg/dL (ref 0.0–1.2)
Bilirubin, Direct: 0.17 mg/dL (ref 0.00–0.40)
Total Protein: 6.8 g/dL (ref 6.0–8.5)

## 2019-05-28 LAB — GLUCOSE, RANDOM: Glucose: 104 mg/dL — ABNORMAL HIGH (ref 65–99)
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
## 2019-06-01 ENCOUNTER — Ambulatory Visit (INDEPENDENT_AMBULATORY_CARE_PROVIDER_SITE_OTHER): Payer: No Typology Code available for payment source | Admitting: Family Medicine

## 2019-06-01 ENCOUNTER — Encounter: Payer: Self-pay | Admitting: Family Medicine

## 2019-06-01 ENCOUNTER — Other Ambulatory Visit: Payer: Self-pay

## 2019-06-01 VITALS — BP 130/84 | Ht 73.0 in | Wt 207.0 lb

## 2019-06-01 DIAGNOSIS — Z5181 Encounter for therapeutic drug level monitoring: Secondary | ICD-10-CM | POA: Diagnosis not present

## 2019-06-01 DIAGNOSIS — R7301 Impaired fasting glucose: Secondary | ICD-10-CM | POA: Diagnosis not present

## 2019-06-01 DIAGNOSIS — R7989 Other specified abnormal findings of blood chemistry: Secondary | ICD-10-CM

## 2019-06-01 DIAGNOSIS — E782 Mixed hyperlipidemia: Secondary | ICD-10-CM | POA: Diagnosis not present

## 2019-06-01 MED ORDER — ATORVASTATIN CALCIUM 20 MG PO TABS: 20.0000 mg | ORAL_TABLET | Freq: Every day | ORAL | 1 refills | Status: DC

## 2019-06-01 NOTE — Patient Instructions (Signed)
Increase your omega-3 fish oil to try and get at least 3000 (to 4000) mg daily. Try taking 2 twice daily (you can take 1 before and 1 after the meal if you are unable to take them at the same time). Increase your exercise to try and get at least 10-15 minutes of cardio at each interval (for a goal of 150 minutes/week).

## 2019-06-02 NOTE — Progress Notes (Signed)
Pt is coming in July the 19th
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
## 2019-11-29 ENCOUNTER — Other Ambulatory Visit: Payer: Self-pay | Admitting: Family Medicine

## 2019-11-29 DIAGNOSIS — E782 Mixed hyperlipidemia: Secondary | ICD-10-CM

## 2019-11-30 ENCOUNTER — Other Ambulatory Visit: Payer: Self-pay

## 2019-11-30 ENCOUNTER — Other Ambulatory Visit: Payer: No Typology Code available for payment source

## 2019-11-30 DIAGNOSIS — Z5181 Encounter for therapeutic drug level monitoring: Secondary | ICD-10-CM

## 2019-11-30 DIAGNOSIS — E782 Mixed hyperlipidemia: Secondary | ICD-10-CM

## 2019-11-30 DIAGNOSIS — R7301 Impaired fasting glucose: Secondary | ICD-10-CM

## 2019-11-30 DIAGNOSIS — R7989 Other specified abnormal findings of blood chemistry: Secondary | ICD-10-CM

## 2019-12-01 LAB — CBC WITH DIFFERENTIAL/PLATELET
Basophils Absolute: 0 10*3/uL (ref 0.0–0.2)
Basos: 1 %
EOS (ABSOLUTE): 0.1 10*3/uL (ref 0.0–0.4)
Eos: 2 %
Hematocrit: 44.1 % (ref 37.5–51.0)
Hemoglobin: 15.2 g/dL (ref 13.0–17.7)
Immature Grans (Abs): 0 10*3/uL (ref 0.0–0.1)
Immature Granulocytes: 0 %
Lymphocytes Absolute: 2.1 10*3/uL (ref 0.7–3.1)
Lymphs: 34 %
MCH: 30.5 pg (ref 26.6–33.0)
MCHC: 34.5 g/dL (ref 31.5–35.7)
MCV: 88 fL (ref 79–97)
Monocytes Absolute: 0.5 10*3/uL (ref 0.1–0.9)
Monocytes: 8 %
Neutrophils Absolute: 3.4 10*3/uL (ref 1.4–7.0)
Neutrophils: 55 %
Platelets: 201 10*3/uL (ref 150–450)
RBC: 4.99 x10E6/uL (ref 4.14–5.80)
RDW: 12.8 % (ref 11.6–15.4)
WBC: 6.1 10*3/uL (ref 3.4–10.8)

## 2019-12-01 LAB — COMPREHENSIVE METABOLIC PANEL
ALT: 43 IU/L (ref 0–44)
AST: 19 IU/L (ref 0–40)
Albumin/Globulin Ratio: 2 (ref 1.2–2.2)
Albumin: 4.7 g/dL (ref 4.0–5.0)
Alkaline Phosphatase: 115 IU/L (ref 48–121)
BUN/Creatinine Ratio: 12 (ref 9–20)
BUN: 11 mg/dL (ref 6–24)
Bilirubin Total: 0.6 mg/dL (ref 0.0–1.2)
CO2: 22 mmol/L (ref 20–29)
Calcium: 9.3 mg/dL (ref 8.7–10.2)
Chloride: 104 mmol/L (ref 96–106)
Creatinine, Ser: 0.95 mg/dL (ref 0.76–1.27)
GFR calc Af Amer: 108 mL/min/{1.73_m2} (ref >59)
GFR calc non Af Amer: 94 mL/min/{1.73_m2} (ref >59)
Globulin, Total: 2.4 g/dL (ref 1.5–4.5)
Glucose: 109 mg/dL — ABNORMAL HIGH (ref 65–99)
Potassium: 4.3 mmol/L (ref 3.5–5.2)
Sodium: 141 mmol/L (ref 134–144)
Total Protein: 7.1 g/dL (ref 6.0–8.5)

## 2019-12-01 LAB — LIPID PANEL
Chol/HDL Ratio: 4.4 ratio (ref 0.0–5.0)
Cholesterol, Total: 124 mg/dL (ref 100–199)
HDL: 28 mg/dL — ABNORMAL LOW (ref >39)
LDL Chol Calc (NIH): 75 mg/dL (ref 0–99)
Triglycerides: 115 mg/dL (ref 0–149)
VLDL Cholesterol Cal: 21 mg/dL (ref 5–40)

## 2019-12-01 LAB — HEMOGLOBIN A1C
Est. average glucose Bld gHb Est-mCnc: 111 mg/dL
Hgb A1c MFr Bld: 5.5 % (ref 4.8–5.6)

## 2019-12-01 NOTE — Patient Instructions (Addendum)
  HEALTH MAINTENANCE RECOMMENDATIONS:  It is recommended that you get at least 30 minutes of aerobic exercise at least 5 days/week (for weight loss, you may need as much as 60-90 minutes). This can be any activity that gets your heart rate up. This can be divided in 10-15 minute intervals if needed, but try and build up your endurance at least once a week.  Weight bearing exercise is also recommended twice weekly.  Eat a healthy diet with lots of vegetables, fruits and fiber.  "Colorful" foods have a lot of vitamins (ie green vegetables, tomatoes, red peppers, etc).  Limit sweet tea, regular sodas and alcoholic beverages, all of which has a lot of calories and sugar.  Up to 2 alcoholic drinks daily may be beneficial for men (unless trying to lose weight, watch sugars).  Drink a lot of water.  Sunscreen of at least SPF 30 should be used on all sun-exposed parts of the skin when outside between the hours of 10 am and 4 pm (not just when at beach or pool, but even with exercise, golf, tennis, and yard work!)  Use a sunscreen that says "broad spectrum" so it covers both UVA and UVB rays, and make sure to reapply every 1-2 hours.  Remember to change the batteries in your smoke detectors when changing your clock times in the spring and fall. Carbon monoxide detectors are recommended for your home.  Use your seat belt every time you are in a car, and please drive safely and not be distracted with cell phones and texting while driving.  I highly recommend that you get the COVID-19 vaccine [employer] or [place]), especially due to having a wife at high risk.    Shingles vaccine is recommended at age 43. Check with your insurance prior to your visit next year.

## 2019-12-01 NOTE — Progress Notes (Signed)
Chief Complaint  Patient presents with  . Annual Exam    nonfasting annual exam, labs already done. Patient has no new concerns. Did not get any COVID vaccines.     John Frazier is a 49 y.o. male who presents for a complete physical.  He had labs done prior to today's visit, see below,  He hasn't gotten COVID vaccines, for the same reasons he doesn't take flu shots. He and his wife are very careful, due to her underlying medication conditions and immunosuppressant drugs.  They do go out to eat some.  Dyslipidemia--He has been on atorvastatin since 02/2016. He reports compliance with medication, and denies side effects. He has had elevated TG, and takes fish oil 2/day. (he previously tooklow dose fenofibratealong withOTC Niacin; changed to statin due to higher LDL).  He is trying to follow lowfat, low cholesterol diet. Cooking more at home, but now also starting to eat out more (not quite as healthy as what they eat at home). Still using air fryer. Eating a lot of chicken (and having ground chicken). He has had mild elevations in ALT and alk phos. CT done 09/2017 (for trauma) didn't comment on any liver/hepatobiliary abnormality.  Impaired fasting glucose.In January, 2019 his fasting glucose was 112, A1c as 5.7.His diet and A1c had improved since then.  He tries to limit his sweets and carbs. He cut back further on his carbs, which has also helped with some weight loss. Plays chase with the dogs. Uses resistance bands for upper extremities during meetings.  Vitamin D deficiency: Treated in past with rx. Currently taking 2000 IU daily.Last levelwas normal in July2018at 37.   Immunization History  Administered Date(s) Administered  . Tdap 01/08/2013   Declines flu shots Last colonoscopy: never Last PSA: never Dentist: regularly, 2x/year  Ophtho: yearly Exercise:Plays chase with the dogs. Uses resistance bands for upper extremities during meetings. Putting in a pool (swim  spa). Not using treadmill (in wife's office, so not always accessible)   PMH, PSH, SH and FH were reviewed and updated  Outpatient Encounter Medications as of 12/03/2019  Medication Sig  . atorvastatin (LIPITOR) 20 MG tablet Take 1 tablet (20 mg total) by mouth daily.  . Cholecalciferol (VITAMIN D) 2000 units tablet Take 2,000 Units by mouth daily.  . Omega-3 Fatty Acids (FISH OIL) 1200 MG CPDR Take 2 capsules by mouth daily.  . [DISCONTINUED] Omega-3 Fatty Acids (FISH OIL) 1200 MG CAPS Take 3 capsules (3,600 mg total) by mouth daily.   No facility-administered encounter medications on file as of 12/03/2019.   No Known Allergies  ROS: The patient denies anorexia, fever, headaches, vision loss, decreased hearing, ear pain, hoarseness, chest pain, palpitations, dizziness, syncope, dyspnea on exertion, cough, swelling, nausea, vomiting, diarrhea, abdominal pain, melena, indigestion/heartburn, hematuria, incontinence, erectile dysfunction, nocturia, weakened urine stream, dysuria, genital lesions, joint pains, numbness, tingling, weakness, tremor, suspicious skin lesions, depression, anxiety, abnormal bleeding/bruising, or enlarged lymph nodes.   PHYSICAL EXAM:  BP 140/90   Pulse 88   Ht _0  (1.854 m)   Wt 202 lb (91.6 kg)   BMI 26.65 kg/m   BP 124/84 on repeat by MD, RA  Wt Readings from Last 3 Encounters:  12/03/19 202 lb (91.6 kg)  06/01/19 207 lb (93.9 kg)  11/26/18 209 lb 12.8 oz (95.2 kg)    General Appearance:   Alert, cooperative, no distress, appears stated age  Head:   Normocephalic, without obvious abnormality, atraumatic  Eyes:   PERRL, conjunctiva/corneas clear, EOM's  intact, fundi benign  Ears:   Normal TM's and external ear canals  Nose:  Not examined (wearing mask due to COVID-19 pandemic)  Throat:  Not examined (wearing mask due to COVID-19 pandemic)  Neck:  Supple, no lymphadenopathy; thyroid: noenlargement/ tenderness/nodules; no carotid  bruit or JVD  Back:  Spine nontender, no curvature, ROM normal, no CVAtenderness  Lungs:   Clear to auscultation bilaterally without wheezes, rales or ronchi; respirations unlabored  Chest Wall:   No tenderness or deformity  Heart:   Regular rate and rhythm, S1 and S2 normal, no murmur, rub or gallop  Breast Exam:   No chest wall tenderness, masses or gynecomastia  Abdomen:   Soft, non-tender, nondistended, normoactive bowel sounds, no masses, no hepatosplenomegaly  Genitalia:   Normal male external genitalia without lesions. Testicles without masses. No inguinal hernias.  Rectal:   Normal sphincter tone, no masses. Prostate is smooth, not enlarged, no nodules.Heme negative stool  Extremities:  No clubbing, cyanosis or edema. Bony deformity at R shoulder from prior fracture injury  Pulses:  2+ and symmetric all extremities  Skin:  Skin color, texture, turgor normal, no lesions. Benign moles noted on chest, back. No changes.  Lymph nodes:  Cervical, supraclavicular, and axillary nodes normal  Neurologic: Normal strength, sensation and gait; reflexes 2+ and symmetric throughout   Psych:Normal mood, affect, hygiene and grooming    Lab Results  Component Value Date   HGBA1C 5.5 11/30/2019   Fasting glucose 109    Chemistry      Component Value Date/Time   NA 141 11/30/2019 0814   K 4.3 11/30/2019 0814   CL 104 11/30/2019 0814   CO2 22 11/30/2019 0814   BUN 11 11/30/2019 0814   CREATININE 0.95 11/30/2019 0814   CREATININE 0.96 11/19/2016 1242      Component Value Date/Time   CALCIUM 9.3 11/30/2019 0814   ALKPHOS 115 11/30/2019 0814   AST 19 11/30/2019 0814   ALT 43 11/30/2019 0814   BILITOT 0.6 11/30/2019 0814     Lab Results  Component Value Date   CHOL 124 11/30/2019   HDL 28 (L) 11/30/2019   LDLCALC 75 11/30/2019   TRIG 115 11/30/2019   CHOLHDL 4.4 11/30/2019   Lab Results  Component Value Date    WBC 6.1 11/30/2019   HGB 15.2 11/30/2019   HCT 44.1 11/30/2019   MCV 88 11/30/2019   PLT 201 11/30/2019     ASSESSMENT/PLAN:  Annual physical exam - Plan: POCT Urinalysis DIP (Proadvantage Device)  Impaired fasting glucose - reviewed diet, exercise, weight. Cont low carb diet.  Elevated LFTs - improved/resolved  Colon cancer screening - refer for colonoscopy - Plan: Ambulatory referral to Gastroenterology  Mixed dyslipidemia - low HDL, otherwise at goal. Cont on current regimen and lowfat, low cholesterol diet - Plan: atorvastatin (LIPITOR) 20 MG tablet  Vaccine counseling - counseled at length regarding COVID and flu vaccines.  Strongly encouraged both, especially COVID vaccines   COVID counseling Hep C and HIV screening recommendations discussed, to be done with labs next time.  Also discussed starting PSA screenings next year  Labs at goal, doing well. F/u 1 year for CPE/med check with labs prior  Recommended at least 30 minutes of aerobic activity at least 5 days/week, weight-bearing exercise at least 2x/wk; proper sunscreen use reviewed; healthy diet and alcohol recommendations (less than or equal to 2 drinks/day) reviewed; regular seatbelt use; changing batteries in smoke detectors. Self-testicular exams. Immunization recommendations discussed--yearly flu shots recommended.  Colon cancer screening now due, reviewed options. Refer to Dr. Carlean Purl (wife sees)

## 2019-12-03 ENCOUNTER — Encounter: Payer: Self-pay | Admitting: Family Medicine

## 2019-12-03 ENCOUNTER — Other Ambulatory Visit: Payer: Self-pay

## 2019-12-03 ENCOUNTER — Ambulatory Visit (INDEPENDENT_AMBULATORY_CARE_PROVIDER_SITE_OTHER): Payer: No Typology Code available for payment source | Admitting: Family Medicine

## 2019-12-03 VITALS — BP 124/84 | HR 88 | Ht 73.0 in | Wt 202.0 lb

## 2019-12-03 DIAGNOSIS — E782 Mixed hyperlipidemia: Secondary | ICD-10-CM | POA: Diagnosis not present

## 2019-12-03 DIAGNOSIS — Z7189 Other specified counseling: Secondary | ICD-10-CM | POA: Diagnosis not present

## 2019-12-03 DIAGNOSIS — R7301 Impaired fasting glucose: Secondary | ICD-10-CM | POA: Diagnosis not present

## 2019-12-03 DIAGNOSIS — Z1211 Encounter for screening for malignant neoplasm of colon: Secondary | ICD-10-CM

## 2019-12-03 DIAGNOSIS — R7989 Other specified abnormal findings of blood chemistry: Secondary | ICD-10-CM

## 2019-12-03 DIAGNOSIS — Z0000 Encounter for general adult medical examination without abnormal findings: Secondary | ICD-10-CM

## 2019-12-03 DIAGNOSIS — Z125 Encounter for screening for malignant neoplasm of prostate: Secondary | ICD-10-CM

## 2019-12-03 DIAGNOSIS — Z7185 Encounter for immunization safety counseling: Secondary | ICD-10-CM

## 2019-12-03 LAB — POCT URINALYSIS DIP (PROADVANTAGE DEVICE)
Blood, UA: NEGATIVE
Glucose, UA: NEGATIVE mg/dL
Ketones, POC UA: NEGATIVE mg/dL
Leukocytes, UA: NEGATIVE
Nitrite, UA: NEGATIVE
Protein Ur, POC: NEGATIVE mg/dL
Specific Gravity, Urine: 1.025
Urobilinogen, Ur: NEGATIVE
pH, UA: 5.5 (ref 5.0–8.0)

## 2019-12-03 MED ORDER — ATORVASTATIN CALCIUM 20 MG PO TABS: 20.0000 mg | ORAL_TABLET | Freq: Every day | ORAL | 1 refills | Status: DC

## 2020-03-16 ENCOUNTER — Encounter: Payer: Self-pay | Admitting: Family Medicine

## 2020-03-22 ENCOUNTER — Telehealth: Payer: Self-pay

## 2020-03-22 NOTE — Telephone Encounter (Signed)
Called [facility] [phone] and this was the P.A. department they were unsure what I was talking about but said she knew of a plan but it automatically changes co pays to 0 according to if they are on this list.  She couldn't tell me what was on the list and tried to send me to mail order and I held for 15 minutes and couldn't reach anyone

## 2020-03-24 NOTE — Telephone Encounter (Signed)
Called [facility] spoke with mail order line pt's A# [phone] & P.A. line again & no one can help me figure out which department would handle the HCR preventative care - PPACA 0 cost share that the pt is talking about.  Called pt back and informed and advised if he can find out who I need to talk with I would be glad to call and help.  The P.A. department can't help because the medication is not rejecting and doesn't require a P.A.

## 2020-08-10 ENCOUNTER — Other Ambulatory Visit: Payer: Self-pay | Admitting: Family Medicine

## 2020-08-10 DIAGNOSIS — E782 Mixed hyperlipidemia: Secondary | ICD-10-CM

## 2020-10-28 ENCOUNTER — Other Ambulatory Visit: Payer: Self-pay | Admitting: Family Medicine

## 2020-10-28 DIAGNOSIS — E782 Mixed hyperlipidemia: Secondary | ICD-10-CM

## 2020-12-05 ENCOUNTER — Other Ambulatory Visit: Payer: No Typology Code available for payment source

## 2020-12-05 ENCOUNTER — Other Ambulatory Visit: Payer: Self-pay

## 2020-12-05 DIAGNOSIS — Z0000 Encounter for general adult medical examination without abnormal findings: Secondary | ICD-10-CM

## 2020-12-05 DIAGNOSIS — R7989 Other specified abnormal findings of blood chemistry: Secondary | ICD-10-CM

## 2020-12-05 DIAGNOSIS — R7301 Impaired fasting glucose: Secondary | ICD-10-CM

## 2020-12-05 DIAGNOSIS — Z125 Encounter for screening for malignant neoplasm of prostate: Secondary | ICD-10-CM

## 2020-12-05 DIAGNOSIS — E782 Mixed hyperlipidemia: Secondary | ICD-10-CM

## 2020-12-05 NOTE — Patient Instructions (Addendum)
  HEALTH MAINTENANCE RECOMMENDATIONS:  It is recommended that you get at least 30 minutes of aerobic exercise at least 5 days/week (for weight loss, you may need as much as 60-90 minutes). This can be any activity that gets your heart rate up. This can be divided in 10-15 minute intervals if needed, but try and build up your endurance at least once a week.  Weight bearing exercise is also recommended twice weekly.  Eat a healthy diet with lots of vegetables, fruits and fiber.  "Colorful" foods have a lot of vitamins (ie green vegetables, tomatoes, red peppers, etc).  Limit sweet tea, regular sodas and alcoholic beverages, all of which has a lot of calories and sugar.  Up to 2 alcoholic drinks daily may be beneficial for men (unless trying to lose weight, watch sugars).  Drink a lot of water.  Sunscreen of at least SPF 30 should be used on all sun-exposed parts of the skin when outside between the hours of 10 am and 4 pm (not just when at beach or pool, but even with exercise, golf, tennis, and yard work!)  Use a sunscreen that says "broad spectrum" so it covers both UVA and UVB rays, and make sure to reapply every 1-2 hours.  Remember to change the batteries in your smoke detectors when changing your clock times in the spring and fall.  Carbon monoxide detectors are recommended for your home.  Use your seat belt every time you are in a car, and please drive safely and not be distracted with cell phones and texting while driving.  I recommend considering the booster that will be available in late Fall, which should be better against Omicron variants of COVID.  I recommend getting the new shingles vaccine (Shingrix). Check with your insurance to verify what your out of pocket cost may be (usually covered as preventative, but better to verify to avoid any surprises, as this vaccine is expensive), and then schedule a nurse visit at our office when convenient (based on the possible side effects as  discussed).   This is a series of 2 injections, spaced 2 months apart.  It doesn't have to be exactly 2 months apart (but can't be under 2 months), if that isn't feasible for your schedule, but try and get them close to 2 months (and definitely within 6 months of each other, or else the efficacy of the vaccine drops off).   You are past due for colon cancer screening. Options include colonoscopy, Cologuard (fecal sample, done every 3 years, and if abnormal then requires a colonoscopy) or FIT testing (a fecal sample done yearly, and if abnormal, also requires colonoscopy). We put in the referral, please call their office to schedule.

## 2020-12-05 NOTE — Progress Notes (Signed)
Chief Complaint  Patient presents with   Annual Exam    Nonfasting annual exam, labs already done. No new concerns. Never got messages from Dr. Carlean Purl.     John Frazier is a 50 y.o. male who presents for a complete physical.  He had labs done prior to today's visit, see below,  He had COVID in June.  He had had a J&J vaccine, no booster.  Has a very mild lingering cough, throat-clearing seems to help prevent cough.  Dyslipidemia--He has been on atorvastatin since 02/2016. He reports compliance with medication, and denies side effects. He has had elevated TG, and takes fish oil 2/day (currently taing 3 of lower dose, but plans to switch back). (he previously took low dose fenofibrate along with OTC Niacin; changed to statin due to higher LDL).  LDL and TG have been normal on this regimen, but very low HDL. He is trying to follow lowfat, low cholesterol diet. Cooking more at home, but now also starting to eat out more (not quite as healthy as what they eat at home). Diet hasn't been as good since niece/nephew had stayed/visited them for 2 months.  Eating more fried foods, eating out. Still using air fryer at home. Typical diet is eating a lot of chicken (and having ground chicken). He has had mild elevations in ALT and alk phos.  CT done 09/2017 (for trauma) didn't comment on any liver/hepatobiliary abnormality. Lab Results  Component Value Date   CHOL 124 11/30/2019   HDL 28 (L) 11/30/2019   LDLCALC 75 11/30/2019   TRIG 115 11/30/2019   CHOLHDL 4.4 11/30/2019    Impaired fasting glucose. In January, 2019 his fasting glucose was 112, A1c as 5.7.  His diet and A1c had improved since then.  Fasting glucoe last year was 109. Last year he was better at limiting his sweets and carbs. Recent changes in diet with family in town--more carbs, fried foods, french fries. Plans to get back on track now that they are gone. Plays chase with the dogs.  Lab Results  Component Value Date   HGBA1C 5.5  11/30/2019    Vitamin D deficiency:  Treated in past with rx. Currently taking 2000 IU daily. Last level was normal in July 2018 at 25.  Immunization History  Administered Date(s) Administered   Tdap 01/08/2013   He had J&J vaccine, no boosters.  Declines flu shots, COVID vaccines Last colonoscopy: never. He was referred to Dr. Carlean Purl last year, they called and LM for him to c/b, but pt states didn't get message. Still willing to go. Last PSA: with recent labs, see below Dentist: regularly, 2x/year  Ophtho: yearly  Exercise: Plays chase with the dogs. No longer uses resistance bands for upper extremities during meetings. Has pool (swim spa), hasn't been using due to the effort of the manual cover, waiting to get electric one.   PMH, PSH, SH and FH were reviewed and updated  Outpatient Encounter Medications as of 12/07/2020  Medication Sig   atorvastatin (LIPITOR) 20 MG tablet TAKE 1 TABLET BY MOUTH  DAILY   Cholecalciferol (VITAMIN D) 2000 units tablet Take 2,000 Units by mouth daily.   Omega-3 Fatty Acids (FISH OIL) 1200 MG CPDR Take 2 capsules by mouth daily.   No facility-administered encounter medications on file as of 12/07/2020.   No Known Allergies  ROS:  The patient denies anorexia, fever, headaches,  vision loss, decreased hearing, ear pain, hoarseness, chest pain, palpitations, dizziness, syncope, dyspnea on exertion, cough, swelling,  nausea, vomiting, diarrhea, abdominal pain, melena, indigestion/heartburn, hematuria, incontinence, erectile dysfunction, nocturia, weakened urine stream, dysuria, genital lesions, joint pains, numbness, tingling, weakness, tremor, suspicious skin lesions, depression, anxiety, abnormal bleeding/bruising, or enlarged lymph nodes. Slight residual cough since COVID.   PHYSICAL EXAM:  BP 130/80   Pulse 72   Ht _0  (1.854 m)   Wt 206 lb 3.2 oz (93.5 kg)   BMI 27.20 kg/m   Wt Readings from Last 3 Encounters:  12/07/20 206 lb 3.2 oz  (93.5 kg)  12/03/19 202 lb (91.6 kg)  06/01/19 207 lb (93.9 kg)   General Appearance:     Alert, cooperative, no distress, appears stated age   Head:     Normocephalic, without obvious abnormality, atraumatic   Eyes:     PERRL, conjunctiva/corneas clear, EOM's intact, fundi benign   Ears:     Normal TM's and external ear canals   Nose:    Not examined (wearing mask due to COVID-19 pandemic)  Throat:    Not examined (wearing mask due to COVID-19 pandemic)  Neck:    Supple, no lymphadenopathy;  thyroid:  no enlargement/ tenderness/nodules; no carotid bruit or JVD   Back:     Spine nontender, no curvature, ROM normal, no CVA tenderness   Lungs:      Clear to auscultation bilaterally without wheezes, rales or ronchi; respirations unlabored   Chest Wall:     No tenderness or deformity    Heart:     Regular rate and rhythm, S1 and S2 normal, no murmur, rub or gallop   Breast Exam:     No chest wall tenderness, masses or gynecomastia   Abdomen:      Soft, non-tender, nondistended, normoactive bowel sounds, no masses, no hepatosplenomegaly   Genitalia:     Normal male external genitalia without lesions.  Testicles without masses.  No inguinal hernias.   Rectal:     Normal sphincter tone, no masses. Prostate is smooth, not enlarged, no nodules. Heme negative stool   Extremities:    No clubbing, cyanosis or edema. Bony deformity at R shoulder from prior fracture injury  Pulses:    2+ and symmetric all extremities   Skin:    Skin color, texture, turgor normal, no lesions.  Benign moles noted on chest, back.  No changes.  Lymph nodes:    Cervical, supraclavicular, and axillary nodes normal   Neurologic:   Normal strength, sensation and gait; reflexes 2+ and symmetric throughout                        Psych:  Normal mood, affect, hygiene and grooming   Lab Results  Component Value Date   CHOL 129 12/05/2020   HDL 31 (L) 12/05/2020   LDLCALC 71 12/05/2020   TRIG 155 (H) 12/05/2020   CHOLHDL 4.2  12/05/2020      Chemistry      Component Value Date/Time   NA 140 12/05/2020 0855   K 4.4 12/05/2020 0855   CL 102 12/05/2020 0855   CO2 22 12/05/2020 0855   BUN 16 12/05/2020 0855   CREATININE 1.04 12/05/2020 0855   CREATININE 0.96 11/19/2016 1242      Component Value Date/Time   CALCIUM 9.6 12/05/2020 0855   ALKPHOS 100 12/05/2020 0855   AST 23 12/05/2020 0855   ALT 51 (H) 12/05/2020 0855   BILITOT 0.9 12/05/2020 0855     Fasting glu 116  Lab Results  Component Value  Date   HGBA1C 5.6 12/05/2020   Lab Results  Component Value Date   WBC 5.7 12/05/2020   HGB 14.9 12/05/2020   HCT 43.1 12/05/2020   MCV 89 12/05/2020   PLT 198 12/05/2020   HIV negative, Hep C screen negative  Lab Results  Component Value Date   PSA1 0.4 12/05/2020       ASSESSMENT/PLAN:  Annual physical exam  Impaired fasting glucose - sugars a little higher (A1c still okay), due to change in diet for the last 2 months. Resume prior routine  Elevated LFTs - up slightly. Encouraged lowfat diet.   Colon cancer screening - Plan: Ambulatory referral to Gastroenterology  Mixed dyslipidemia - TG slightly high due to dietary changes.  Resume lowfat diet, continue current medications. - Plan: atorvastatin (LIPITOR) 20 MG tablet  Annual physical exam - Plan: Hemoglobin A1c, CBC with Differential/Platelet, Lipid panel, Comprehensive metabolic panel, PSA  Impaired fasting glucose - sugars a little higher (A1c still okay), due to change in diet for the last 2 months. Resume prior routine - Plan: Hemoglobin A1c, Comprehensive metabolic panel  Elevated LFTs - up slightly. Encouraged lowfat diet.   Colon cancer screening - Plan: Ambulatory referral to Gastroenterology  Mixed dyslipidemia - TG slightly high due to dietary changes.  Resume lowfat diet, continue current medications. - Plan: atorvastatin (LIPITOR) 20 MG tablet, Lipid panel  Screening for prostate cancer - Plan: PSA  Medication  monitoring encounter - Plan: CBC with Differential/Platelet, Lipid panel, Comprehensive metabolic panel   F/u 1 year for CPE/med check with labs prior Declines sooner visit or labs, will be back to regular routine  Recommended at least 30 minutes of aerobic activity at least 5 days/week, weight-bearing exercise at least 2x/wk; proper sunscreen use reviewed; healthy diet and alcohol recommendations (less than or equal to 2 drinks/day) reviewed; regular seatbelt use; changing batteries in smoke detectors.  Immunization recommendations discussed--yearly flu shots recommended, COVID vaccines recommended.  Shingrix recommended--to check insurance and schedule [place] when convenient. Colon cancer screening is past due, reviewed options. We referred to Dr. Carlean Purl last year, but he never scheduled. Referred back.  COVID booster recommended--can wait until new one comes out in late Fall (better omicron coverage)

## 2020-12-06 LAB — LIPID PANEL
Chol/HDL Ratio: 4.2 ratio (ref 0.0–5.0)
Cholesterol, Total: 129 mg/dL (ref 100–199)
HDL: 31 mg/dL — ABNORMAL LOW (ref >39)
LDL Chol Calc (NIH): 71 mg/dL (ref 0–99)
Triglycerides: 155 mg/dL — ABNORMAL HIGH (ref 0–149)
VLDL Cholesterol Cal: 27 mg/dL (ref 5–40)

## 2020-12-06 LAB — CBC WITH DIFFERENTIAL/PLATELET
Basophils Absolute: 0 10*3/uL (ref 0.0–0.2)
Basos: 0 %
EOS (ABSOLUTE): 0.2 10*3/uL (ref 0.0–0.4)
Eos: 3 %
Hematocrit: 43.1 % (ref 37.5–51.0)
Hemoglobin: 14.9 g/dL (ref 13.0–17.7)
Immature Grans (Abs): 0 10*3/uL (ref 0.0–0.1)
Immature Granulocytes: 0 %
Lymphocytes Absolute: 1.9 10*3/uL (ref 0.7–3.1)
Lymphs: 34 %
MCH: 30.8 pg (ref 26.6–33.0)
MCHC: 34.6 g/dL (ref 31.5–35.7)
MCV: 89 fL (ref 79–97)
Monocytes Absolute: 0.4 10*3/uL (ref 0.1–0.9)
Monocytes: 8 %
Neutrophils Absolute: 3.1 10*3/uL (ref 1.4–7.0)
Neutrophils: 55 %
Platelets: 198 10*3/uL (ref 150–450)
RBC: 4.83 x10E6/uL (ref 4.14–5.80)
RDW: 13 % (ref 11.6–15.4)
WBC: 5.7 10*3/uL (ref 3.4–10.8)

## 2020-12-06 LAB — HIV ANTIBODY (ROUTINE TESTING W REFLEX): HIV Screen 4th Generation wRfx: NONREACTIVE

## 2020-12-06 LAB — COMPREHENSIVE METABOLIC PANEL
ALT: 51 IU/L — ABNORMAL HIGH (ref 0–44)
AST: 23 IU/L (ref 0–40)
Albumin/Globulin Ratio: 1.8 (ref 1.2–2.2)
Albumin: 4.5 g/dL (ref 4.0–5.0)
Alkaline Phosphatase: 100 IU/L (ref 44–121)
BUN/Creatinine Ratio: 15 (ref 9–20)
BUN: 16 mg/dL (ref 6–24)
Bilirubin Total: 0.9 mg/dL (ref 0.0–1.2)
CO2: 22 mmol/L (ref 20–29)
Calcium: 9.6 mg/dL (ref 8.7–10.2)
Chloride: 102 mmol/L (ref 96–106)
Creatinine, Ser: 1.04 mg/dL (ref 0.76–1.27)
Globulin, Total: 2.5 g/dL (ref 1.5–4.5)
Glucose: 116 mg/dL — ABNORMAL HIGH (ref 65–99)
Potassium: 4.4 mmol/L (ref 3.5–5.2)
Sodium: 140 mmol/L (ref 134–144)
Total Protein: 7 g/dL (ref 6.0–8.5)
eGFR: 87 mL/min/{1.73_m2} (ref >59)

## 2020-12-06 LAB — HEMOGLOBIN A1C
Est. average glucose Bld gHb Est-mCnc: 114 mg/dL
Hgb A1c MFr Bld: 5.6 % (ref 4.8–5.6)

## 2020-12-06 LAB — PSA: Prostate Specific Ag, Serum: 0.4 ng/mL (ref 0.0–4.0)

## 2020-12-06 LAB — HEPATITIS C ANTIBODY: Hep C Virus Ab: 0.1 s/co ratio (ref 0.0–0.9)

## 2020-12-07 ENCOUNTER — Encounter: Payer: Self-pay | Admitting: Family Medicine

## 2020-12-07 ENCOUNTER — Ambulatory Visit (INDEPENDENT_AMBULATORY_CARE_PROVIDER_SITE_OTHER): Payer: No Typology Code available for payment source | Admitting: Family Medicine

## 2020-12-07 ENCOUNTER — Other Ambulatory Visit: Payer: Self-pay

## 2020-12-07 VITALS — BP 130/80 | HR 72 | Ht 73.0 in | Wt 206.2 lb

## 2020-12-07 DIAGNOSIS — E782 Mixed hyperlipidemia: Secondary | ICD-10-CM

## 2020-12-07 DIAGNOSIS — Z125 Encounter for screening for malignant neoplasm of prostate: Secondary | ICD-10-CM

## 2020-12-07 DIAGNOSIS — R7989 Other specified abnormal findings of blood chemistry: Secondary | ICD-10-CM | POA: Diagnosis not present

## 2020-12-07 DIAGNOSIS — R7301 Impaired fasting glucose: Secondary | ICD-10-CM | POA: Diagnosis not present

## 2020-12-07 DIAGNOSIS — Z0000 Encounter for general adult medical examination without abnormal findings: Secondary | ICD-10-CM | POA: Diagnosis not present

## 2020-12-07 DIAGNOSIS — Z1211 Encounter for screening for malignant neoplasm of colon: Secondary | ICD-10-CM | POA: Diagnosis not present

## 2020-12-07 DIAGNOSIS — Z5181 Encounter for therapeutic drug level monitoring: Secondary | ICD-10-CM

## 2020-12-07 LAB — POCT URINALYSIS DIP (PROADVANTAGE DEVICE)
Bilirubin, UA: NEGATIVE
Blood, UA: NEGATIVE
Glucose, UA: NEGATIVE mg/dL
Leukocytes, UA: NEGATIVE
Nitrite, UA: NEGATIVE
Protein Ur, POC: NEGATIVE mg/dL
Specific Gravity, Urine: 1.03
Urobilinogen, Ur: NEGATIVE
pH, UA: 6 (ref 5.0–8.0)

## 2020-12-07 MED ORDER — ATORVASTATIN CALCIUM 20 MG PO TABS: 20.0000 mg | ORAL_TABLET | Freq: Every day | ORAL | 3 refills | Status: DC

## 2020-12-07 NOTE — Addendum Note (Signed)
Addended by: Debbrah Alar F on: 12/07/2020 09:41 AM   Modules accepted: Orders

## 2021-07-14 ENCOUNTER — Other Ambulatory Visit: Payer: Self-pay | Admitting: Family Medicine

## 2021-07-14 DIAGNOSIS — Z5181 Encounter for therapeutic drug level monitoring: Secondary | ICD-10-CM

## 2021-07-14 DIAGNOSIS — R7989 Other specified abnormal findings of blood chemistry: Secondary | ICD-10-CM

## 2021-07-14 DIAGNOSIS — Z0000 Encounter for general adult medical examination without abnormal findings: Secondary | ICD-10-CM

## 2021-09-20 ENCOUNTER — Encounter: Payer: Self-pay | Admitting: Family Medicine

## 2021-09-20 ENCOUNTER — Other Ambulatory Visit: Payer: Self-pay | Admitting: *Deleted

## 2021-09-20 DIAGNOSIS — E782 Mixed hyperlipidemia: Secondary | ICD-10-CM

## 2021-09-20 MED ORDER — ATORVASTATIN CALCIUM 20 MG PO TABS: 20.0000 mg | ORAL_TABLET | Freq: Every day | ORAL | 0 refills | Status: DC

## 2021-12-07 ENCOUNTER — Other Ambulatory Visit: Payer: Managed Care, Other (non HMO)

## 2021-12-07 DIAGNOSIS — R7301 Impaired fasting glucose: Secondary | ICD-10-CM

## 2021-12-07 DIAGNOSIS — Z125 Encounter for screening for malignant neoplasm of prostate: Secondary | ICD-10-CM

## 2021-12-07 DIAGNOSIS — Z5181 Encounter for therapeutic drug level monitoring: Secondary | ICD-10-CM

## 2021-12-07 DIAGNOSIS — R7989 Other specified abnormal findings of blood chemistry: Secondary | ICD-10-CM

## 2021-12-07 DIAGNOSIS — E782 Mixed hyperlipidemia: Secondary | ICD-10-CM

## 2021-12-07 DIAGNOSIS — Z0000 Encounter for general adult medical examination without abnormal findings: Secondary | ICD-10-CM

## 2021-12-08 LAB — COMPREHENSIVE METABOLIC PANEL
ALT: 55 IU/L — ABNORMAL HIGH (ref 0–44)
AST: 22 IU/L (ref 0–40)
Albumin/Globulin Ratio: 1.7 (ref 1.2–2.2)
Albumin: 4.7 g/dL (ref 3.8–4.9)
Alkaline Phosphatase: 115 IU/L (ref 44–121)
BUN/Creatinine Ratio: 14 (ref 9–20)
BUN: 15 mg/dL (ref 6–24)
Bilirubin Total: 0.6 mg/dL (ref 0.0–1.2)
CO2: 23 mmol/L (ref 20–29)
Calcium: 9.6 mg/dL (ref 8.7–10.2)
Chloride: 102 mmol/L (ref 96–106)
Creatinine, Ser: 1.04 mg/dL (ref 0.76–1.27)
Globulin, Total: 2.7 g/dL (ref 1.5–4.5)
Glucose: 124 mg/dL — ABNORMAL HIGH (ref 70–99)
Potassium: 4.5 mmol/L (ref 3.5–5.2)
Sodium: 137 mmol/L (ref 134–144)
Total Protein: 7.4 g/dL (ref 6.0–8.5)
eGFR: 87 mL/min/{1.73_m2} (ref >59)

## 2021-12-08 LAB — CBC WITH DIFFERENTIAL/PLATELET
Basophils Absolute: 0 10*3/uL (ref 0.0–0.2)
Basos: 0 %
EOS (ABSOLUTE): 0.2 10*3/uL (ref 0.0–0.4)
Eos: 2 %
Hematocrit: 47.7 % (ref 37.5–51.0)
Hemoglobin: 15.8 g/dL (ref 13.0–17.7)
Immature Grans (Abs): 0 10*3/uL (ref 0.0–0.1)
Immature Granulocytes: 0 %
Lymphocytes Absolute: 2.2 10*3/uL (ref 0.7–3.1)
Lymphs: 31 %
MCH: 30.2 pg (ref 26.6–33.0)
MCHC: 33.1 g/dL (ref 31.5–35.7)
MCV: 91 fL (ref 79–97)
Monocytes Absolute: 0.5 10*3/uL (ref 0.1–0.9)
Monocytes: 8 %
Neutrophils Absolute: 4.1 10*3/uL (ref 1.4–7.0)
Neutrophils: 59 %
Platelets: 212 10*3/uL (ref 150–450)
RBC: 5.24 x10E6/uL (ref 4.14–5.80)
RDW: 12.5 % (ref 11.6–15.4)
WBC: 7 10*3/uL (ref 3.4–10.8)

## 2021-12-08 LAB — PSA: Prostate Specific Ag, Serum: 0.4 ng/mL (ref 0.0–4.0)

## 2021-12-08 LAB — HEMOGLOBIN A1C
Est. average glucose Bld gHb Est-mCnc: 117 mg/dL
Hgb A1c MFr Bld: 5.7 % — ABNORMAL HIGH (ref 4.8–5.6)

## 2021-12-08 LAB — LIPID PANEL
Chol/HDL Ratio: 4.6 ratio (ref 0.0–5.0)
Cholesterol, Total: 148 mg/dL (ref 100–199)
HDL: 32 mg/dL — ABNORMAL LOW (ref >39)
LDL Chol Calc (NIH): 79 mg/dL (ref 0–99)
Triglycerides: 220 mg/dL — ABNORMAL HIGH (ref 0–149)
VLDL Cholesterol Cal: 37 mg/dL (ref 5–40)

## 2021-12-09 NOTE — Progress Notes (Unsigned)
No chief complaint on file.   John Frazier is a 51 y.o. male who presents for a complete physical.  He had labs done prior to today's visit, see below,  Dyslipidemia--He has been on atorvastatin since 02/2016. He reports compliance with medication, and denies side effects. He has had elevated TG, and takes fish oil 2/day. (he previously took low dose fenofibrate along with OTC Niacin; changed to statin due to higher LDL).   LDL and TG have been normal on this regimen, but very low HDL. Last year (11/2020) TG was slightly up at 155, HDL 31. He is trying to follow lowfat, low cholesterol diet.  Last year he reported eating out  more, eating more fried foods.  Still using air fryer at home. Typical diet is eating a lot of chicken (and having ground chicken). He has had mild elevations in ALT and alk phos.  CT done 09/2017 (for trauma) didn't comment on any liver/hepatobiliary abnormality.  Impaired fasting glucose. In January, 2019 his fasting glucose was 112, A1c as 5.7.  Sugar and A1c improved when diet was better. Last year A1c was 5.6%, fasting glucose was 116.  He was eating out more--eating more carbs, fried foods, french fries at that time.   Vitamin D deficiency:  Treated in past with rx. Currently taking 2000 IU daily. Last level was normal in July 2018 at 72.  Immunization History  Administered Date(s) Administered   Janssen (J&J) SARS-COV-2 Vaccination 03/22/2020   Tdap 01/08/2013   He had J&J vaccine, no boosters. Declines flu shots, COVID boosters Last colonoscopy: never. He was referred to Dr. Carlean Purl the last 2 years; he never scheduled. Last PSA: with recent labs Lab Results  Component Value Date   PSA1 0.4 12/07/2021   PSA1 0.4 12/05/2020   Dentist: regularly, 2x/year  Ophtho: yearly  Exercise:  Plays chase with the dogs. No longer uses resistance bands for upper extremities during meetings. Has pool (swim spa), hasn't been using due to the effort of the manual cover,  waiting to get electric one.   PMH, PSH, SH and FH were reviewed and updated   No Known Allergies  ROS:  The patient denies anorexia, fever, headaches,  vision loss, decreased hearing, ear pain, hoarseness, chest pain, palpitations, dizziness, syncope, dyspnea on exertion, cough, swelling, nausea, vomiting, diarrhea, abdominal pain, melena, indigestion/heartburn, hematuria, incontinence, erectile dysfunction, nocturia, weakened urine stream, dysuria, genital lesions, joint pains, numbness, tingling, weakness, tremor, suspicious skin lesions, depression, anxiety, abnormal bleeding/bruising, or enlarged lymph nodes.    PHYSICAL EXAM:  There were no vitals taken for this visit.  Wt Readings from Last 3 Encounters:  12/07/20 206 lb 3.2 oz (93.5 kg)  12/03/19 202 lb (91.6 kg)  06/01/19 207 lb (93.9 kg)   General Appearance:     Alert, cooperative, no distress, appears stated age   Head:     Normocephalic, without obvious abnormality, atraumatic   Eyes:     PERRL, conjunctiva/corneas clear, EOM's intact, fundi benign   Ears:     Normal TM's and external ear canals   Nose:    Normal, no drainage or sinus tenderness  Throat:    Normal, no lesions  Neck:    Supple, no lymphadenopathy;  thyroid:  no enlargement/ tenderness/nodules; no carotid bruit or JVD   Back:     Spine nontender, no curvature, ROM normal, no CVA tenderness   Lungs:      Clear to auscultation bilaterally without wheezes, rales or ronchi; respirations unlabored  Chest Wall:     No tenderness or deformity    Heart:     Regular rate and rhythm, S1 and S2 normal, no murmur, rub or gallop   Breast Exam:     No chest wall tenderness, masses or gynecomastia   Abdomen:      Soft, non-tender, nondistended, normoactive bowel sounds, no masses, no hepatosplenomegaly   Genitalia:     Normal male external genitalia without lesions.  Testicles without masses.  No inguinal hernias.   Rectal:     Normal sphincter tone, no masses.  Prostate is smooth, not enlarged, no nodules. Heme negative stool   Extremities:    No clubbing, cyanosis or edema. Bony deformity at R shoulder from prior fracture injury  Pulses:    2+ and symmetric all extremities   Skin:    Skin color, texture, turgor normal, no lesions.  Benign moles noted on chest, back.  No changes.  Lymph nodes:    Cervical, supraclavicular, and axillary nodes normal   Neurologic:   Normal strength, sensation and gait; reflexes 2+ and symmetric throughout                    Psych:   Normal mood, affect, hygiene and grooming   Lab Results  Component Value Date   CHOL 148 12/07/2021   HDL 32 (L) 12/07/2021   LDLCALC 79 12/07/2021   TRIG 220 (H) 12/07/2021   CHOLHDL 4.6 12/07/2021      Chemistry      Component Value Date/Time   NA 137 12/07/2021 0846   K 4.5 12/07/2021 0846   CL 102 12/07/2021 0846   CO2 23 12/07/2021 0846   BUN 15 12/07/2021 0846   CREATININE 1.04 12/07/2021 0846   CREATININE 0.96 11/19/2016 1242      Component Value Date/Time   CALCIUM 9.6 12/07/2021 0846   ALKPHOS 115 12/07/2021 0846   AST 22 12/07/2021 0846   ALT 55 (H) 12/07/2021 0846   BILITOT 0.6 12/07/2021 0846     Fasting glu 124  Lab Results  Component Value Date   HGBA1C 5.7 (H) 12/07/2021   Lab Results  Component Value Date   WBC 7.0 12/07/2021   HGB 15.8 12/07/2021   HCT 47.7 12/07/2021   MCV 91 12/07/2021   PLT 212 12/07/2021   Lab Results  Component Value Date   PSA1 0.4 12/07/2021   PSA1 0.4 12/05/2020     ASSESSMENT/PLAN:  Shingrix today? Vs NV  He never scheduled colonoscopy (referred the last 2 years; they sent MyChart message in 05/2021 with # to call to schedule, nothing scheduled). Consider Cologuard--maybe he will actually do that, since doesn't appear to do colonoscopy despite telling me that he will schedule...  RF lipitor  Given worsening TG, fglu, prefer 6 month med check. Lipids, c-met prior (offer A1c)  Recommended at least 30  minutes of aerobic activity at least 5 days/week, weight-bearing exercise at least 2x/wk; proper sunscreen use reviewed; healthy diet and alcohol recommendations (less than or equal to 2 drinks/day) reviewed; regular seatbelt use; changing batteries in smoke detectors.   Colon cancer screening is past due, reviewed options. We referred to Dr. Carlean Purl last 2 years, but he never scheduled. Immunization recommendations discussed--yearly flu shots recommended, COVID booster recommended--can wait until new one comes out in the Fall.  Shingrix recommended--to check insurance and schedule NV when convenient.

## 2021-12-09 NOTE — Patient Instructions (Incomplete)
  HEALTH MAINTENANCE RECOMMENDATIONS:  It is recommended that you get at least 30 minutes of aerobic exercise at least 5 days/week (for weight loss, you may need as much as 60-90 minutes). This can be any activity that gets your heart rate up. This can be divided in 10-15 minute intervals if needed, but try and build up your endurance at least once a week.  Weight bearing exercise is also recommended twice weekly.  Eat a healthy diet with lots of vegetables, fruits and fiber.  "Colorful" foods have a lot of vitamins (ie green vegetables, tomatoes, red peppers, etc).  Limit sweet tea, regular sodas and alcoholic beverages, all of which has a lot of calories and sugar.  Up to 2 alcoholic drinks daily may be beneficial for men (unless trying to lose weight, watch sugars).  Drink a lot of water.  Sunscreen of at least SPF 30 should be used on all sun-exposed parts of the skin when outside between the hours of 10 am and 4 pm (not just when at beach or pool, but even with exercise, golf, tennis, and yard work!)  Use a sunscreen that says "broad spectrum" so it covers both UVA and UVB rays, and make sure to reapply every 1-2 hours.  Remember to change the batteries in your smoke detectors when changing your clock times in the spring and fall.  Carbon monoxide detectors are recommended for your home.  Use your seat belt every time you are in a car, and please drive safely and not be distracted with cell phones and texting while driving.  I recommend getting the new shingles vaccine (Shingrix). You may want to check with your insurance to verify what your out of pocket cost may be (usually covered as preventative, but better to verify to avoid any surprises, as this vaccine is expensive), and then schedule a nurse visit at our office when convenient (based on the possible side effects as discussed).   This is a series of 2 injections, spaced 2 months apart.  It doesn't have to be exactly 2 months apart (but  can't be sooner), if that isn't feasible for your schedule, but try and get them close to 2 months (and definitely within 6 months of each other, or else the efficacy of the vaccine drops off). This should be separated from other vaccines by at least 2 weeks.  I encourage you to get yearly flu shots. I encourage you to get a COVID booster--I suggest waiting for the the newly updated bivalent vaccine  that should be out in September.  Colon cancer screening is past due.  We again reviewed screening options.

## 2021-12-11 ENCOUNTER — Encounter: Payer: Self-pay | Admitting: Family Medicine

## 2021-12-11 ENCOUNTER — Ambulatory Visit (INDEPENDENT_AMBULATORY_CARE_PROVIDER_SITE_OTHER): Payer: Managed Care, Other (non HMO) | Admitting: Family Medicine

## 2021-12-11 VITALS — BP 134/98 | HR 80 | Ht 73.0 in | Wt 214.4 lb

## 2021-12-11 DIAGNOSIS — R7301 Impaired fasting glucose: Secondary | ICD-10-CM

## 2021-12-11 DIAGNOSIS — R030 Elevated blood-pressure reading, without diagnosis of hypertension: Secondary | ICD-10-CM | POA: Diagnosis not present

## 2021-12-11 DIAGNOSIS — R7989 Other specified abnormal findings of blood chemistry: Secondary | ICD-10-CM | POA: Diagnosis not present

## 2021-12-11 DIAGNOSIS — Z0000 Encounter for general adult medical examination without abnormal findings: Secondary | ICD-10-CM | POA: Diagnosis not present

## 2021-12-11 DIAGNOSIS — E782 Mixed hyperlipidemia: Secondary | ICD-10-CM

## 2021-12-11 DIAGNOSIS — Z1211 Encounter for screening for malignant neoplasm of colon: Secondary | ICD-10-CM | POA: Diagnosis not present

## 2021-12-11 MED ORDER — ATORVASTATIN CALCIUM 20 MG PO TABS: 20.0000 mg | ORAL_TABLET | Freq: Every day | ORAL | 3 refills | Status: DC

## 2021-12-16 LAB — COLOGUARD: Cologuard: NEGATIVE

## 2021-12-22 LAB — COLOGUARD: COLOGUARD: NEGATIVE

## 2022-01-17 ENCOUNTER — Encounter: Payer: Self-pay | Admitting: Internal Medicine

## 2022-02-20 ENCOUNTER — Encounter: Payer: Self-pay | Admitting: Internal Medicine

## 2022-03-12 ENCOUNTER — Other Ambulatory Visit: Payer: Managed Care, Other (non HMO)

## 2022-03-12 DIAGNOSIS — E782 Mixed hyperlipidemia: Secondary | ICD-10-CM

## 2022-03-12 DIAGNOSIS — R7301 Impaired fasting glucose: Secondary | ICD-10-CM

## 2022-03-13 LAB — GLUCOSE, RANDOM: Glucose: 120 mg/dL — ABNORMAL HIGH (ref 70–99)

## 2022-03-13 LAB — LIPID PANEL
Chol/HDL Ratio: 4.7 ratio (ref 0.0–5.0)
Cholesterol, Total: 156 mg/dL (ref 100–199)
HDL: 33 mg/dL — ABNORMAL LOW (ref >39)
LDL Chol Calc (NIH): 84 mg/dL (ref 0–99)
Triglycerides: 232 mg/dL — ABNORMAL HIGH (ref 0–149)
VLDL Cholesterol Cal: 39 mg/dL (ref 5–40)

## 2022-03-14 NOTE — Progress Notes (Unsigned)
Start time: End time:  Virtual Visit via Video Note  I connected with John Frazier on 03/14/22 by a video enabled telemedicine application and verified that I am speaking with the correct person using two identifiers.  Location: Patient: *** Provider: office   I discussed the limitations of evaluation and management by telemedicine and the availability of in person appointments. The patient expressed understanding and agreed to proceed.  History of Present Illness:  No chief complaint on file.  Patient presents for 3 month follow-up on elevated blood pressures, impaired fasting glucose and elevated TG.  Elevated BP's-- Noted at his physical. Also reported borderline high BP's at pharmacy as well. He had been eating more sodium. We had discussed low Na diet, exercise and wt loss.  BP's have been running   BP Readings from Last 3 Encounters:  12/11/21 (!) 134/98  12/07/20 130/80  12/03/19 124/84   Impaired fasting glucose:  glucose was 124 at his physical, with A1c of 5.7.  Dietary changes  Exercise  Mixed hyperlipidemia: Triglycerides were higher at his physical.  Had been good the 2 years prior.   He had reported poor diet for 3 weeks when nephew was visiting. Chinese (noodles), [place] (ground beef, cheese, sour cream, chips).  Still using air fryer at home. Typical diet is eating a lot of chicken. In July he reported eating more sweets, usually cookies (keeps them in the house). He hadn't been getting any regular physical activity, and had gained weight. The plan was to restart Weight Watchers. He continues on atorvastatin 20mg  daily and 2000mg  of fish oil daily.  Component Ref Range & Units 3 mo ago (12/07/21) 1 yr ago (12/05/20) 2 yr ago (11/30/19) 2 yr ago (05/27/19) 3 yr ago (11/24/18) 3 yr ago (05/28/18) 4 yr ago (01/20/18)  Cholesterol, Total 100 - 199 mg/dL 05/30/18  03/22/18  629  528  413  121  133   Triglycerides 0 - 149 mg/dL 244 High   010 High   272  177 High   202  High   195 High   206 High    HDL >39 mg/dL 32 Low   31 Low   28 Low   27 Low   32 Low   24 Low   30 Low    VLDL Cholesterol Cal 5 - 40 mg/dL 37  27  21  30   40  39  41 High    LDL Chol Calc (NIH) 0 - 99 mg/dL 79  71  75  66      Chol/HDL Ratio 0.0 - 5.0 ratio 4.6  4.2 CM  4.4 CM             Observations/Objective:  There were no vitals taken for this visit.  Wt Readings from Last 3 Encounters:  12/11/21 214 lb 6.4 oz (97.3 kg)  12/07/20 206 lb 3.2 oz (93.5 kg)  12/03/19 202 lb (91.6 kg)     Lab Results  Component Value Date   CHOL 156 03/12/2022   HDL 33 (L) 03/12/2022   LDLCALC 84 03/12/2022   TRIG 232 (H) 03/12/2022   CHOLHDL 4.7 03/12/2022   Fasting glucose 120  Assessment and Plan:   Elevated TG, low HDL. ? Increase fish oil to 4000mg , vs restart low dose fenofibrate?   Schedule NV for shingrix  (Declines flu and COVID) ?  Follow Up Instructions:    I discussed the assessment and treatment plan with the patient. The patient was provided an  opportunity to ask questions and all were answered. The patient agreed with the plan and demonstrated an understanding of the instructions.   The patient was advised to call back or seek an in-person evaluation if the symptoms worsen or if the condition fails to improve as anticipated.  I spent *** minutes dedicated to the care of this patient, including pre-visit review of records, face to face time, post-visit ordering of testing and documentation.    Vikki Ports, MD

## 2022-03-14 NOTE — Patient Instructions (Incomplete)
I recommend getting the new shingles vaccine (Shingrix). You may want to check with your insurance to verify what your out of pocket cost may be (usually covered as preventative, but better to verify to avoid any surprises, as this vaccine is expensive), and then schedule a nurse visit at our office when convenient (based on the possible side effects as discussed).   This is a series of 2 injections, spaced 2 months apart.  It doesn't have to be exactly 2 months apart (but can't be sooner), if that isn't feasible for your schedule, but try and get them close to 2 months (and definitely within 6 months of each other, or else the efficacy of the vaccine drops off). This should be separated from other vaccines by at least 2 weeks.   Eat more fruits and vegetables. Cut back on your portions of carbs (rice, pasta).  Try and use whole grain rice (brown), pasta. I encourage you to resume Weight Watchers to hold you accountable to your food choices and portions. Try and get some daily exercise.  Continue the 2000mg  of omega-3 fish oil twice daily.

## 2022-03-15 ENCOUNTER — Encounter: Payer: Self-pay | Admitting: Family Medicine

## 2022-03-15 ENCOUNTER — Telehealth (INDEPENDENT_AMBULATORY_CARE_PROVIDER_SITE_OTHER): Payer: Managed Care, Other (non HMO) | Admitting: Family Medicine

## 2022-03-15 VITALS — BP 120/70 | HR 76 | Ht 73.0 in | Wt 214.0 lb

## 2022-03-15 DIAGNOSIS — Z6828 Body mass index (BMI) 28.0-28.9, adult: Secondary | ICD-10-CM

## 2022-03-15 DIAGNOSIS — E782 Mixed hyperlipidemia: Secondary | ICD-10-CM

## 2022-03-15 DIAGNOSIS — R7301 Impaired fasting glucose: Secondary | ICD-10-CM | POA: Diagnosis not present

## 2022-03-15 DIAGNOSIS — Z5181 Encounter for therapeutic drug level monitoring: Secondary | ICD-10-CM

## 2022-03-15 DIAGNOSIS — R7989 Other specified abnormal findings of blood chemistry: Secondary | ICD-10-CM

## 2022-03-23 ENCOUNTER — Other Ambulatory Visit (INDEPENDENT_AMBULATORY_CARE_PROVIDER_SITE_OTHER): Payer: Managed Care, Other (non HMO)

## 2022-03-23 DIAGNOSIS — Z23 Encounter for immunization: Secondary | ICD-10-CM | POA: Diagnosis not present

## 2022-06-14 ENCOUNTER — Encounter: Payer: Self-pay | Admitting: Family Medicine

## 2022-06-18 ENCOUNTER — Other Ambulatory Visit (INDEPENDENT_AMBULATORY_CARE_PROVIDER_SITE_OTHER): Payer: Managed Care, Other (non HMO)

## 2022-06-18 DIAGNOSIS — Z5181 Encounter for therapeutic drug level monitoring: Secondary | ICD-10-CM

## 2022-06-18 DIAGNOSIS — Z23 Encounter for immunization: Secondary | ICD-10-CM

## 2022-06-18 DIAGNOSIS — R7301 Impaired fasting glucose: Secondary | ICD-10-CM

## 2022-06-18 DIAGNOSIS — R7989 Other specified abnormal findings of blood chemistry: Secondary | ICD-10-CM

## 2022-06-18 DIAGNOSIS — E782 Mixed hyperlipidemia: Secondary | ICD-10-CM

## 2022-06-19 LAB — HEPATIC FUNCTION PANEL
ALT: 83 IU/L — ABNORMAL HIGH (ref 0–44)
AST: 28 IU/L (ref 0–40)
Albumin: 4.9 g/dL (ref 3.8–4.9)
Alkaline Phosphatase: 127 IU/L — ABNORMAL HIGH (ref 44–121)
Bilirubin Total: 0.6 mg/dL (ref 0.0–1.2)
Bilirubin, Direct: 0.15 mg/dL (ref 0.00–0.40)
Total Protein: 7.2 g/dL (ref 6.0–8.5)

## 2022-06-19 LAB — LIPID PANEL
Chol/HDL Ratio: 4.4 ratio (ref 0.0–5.0)
Cholesterol, Total: 142 mg/dL (ref 100–199)
HDL: 32 mg/dL — ABNORMAL LOW (ref >39)
LDL Chol Calc (NIH): 79 mg/dL (ref 0–99)
Triglycerides: 181 mg/dL — ABNORMAL HIGH (ref 0–149)
VLDL Cholesterol Cal: 31 mg/dL (ref 5–40)

## 2022-06-19 LAB — HEMOGLOBIN A1C
Est. average glucose Bld gHb Est-mCnc: 123 mg/dL
Hgb A1c MFr Bld: 5.9 % — ABNORMAL HIGH (ref 4.8–5.6)

## 2022-06-19 LAB — GLUCOSE, RANDOM: Glucose: 129 mg/dL — ABNORMAL HIGH (ref 70–99)

## 2022-06-20 NOTE — Progress Notes (Unsigned)
Start time: End time:  Virtual Visit via Video Note  I connected with John Frazier on 06/20/22 by a video enabled telemedicine application and verified that I am speaking with the correct person using two identifiers.  Location: Patient: *** Provider: office   I discussed the limitations of evaluation and management by telemedicine and the availability of in person appointments. The patient expressed understanding and agreed to proceed.  History of Present Illness:  No chief complaint on file.   Mixed hyperlipidemia: Triglycerides were high in July and October. At that time he was on 20mg  atorvastatin and he had increased his fish oil dose to 2000mg  shortly before visit.  We had discussed adding low dose fibrate, but he declined that, preferred to increase fish oil to 4000mg  daily and continue to work on diet.  In October he reported cutting back on sweets (compared to July)  He was having shakes for lunches.  Adds a banana to a protein shake (slimfast powder with 1% milk--didn't want to try other milks or skim). Doesn't eat breakfast. Dinners--wife cooks.   In October the reported limiting to 1 carb per meal, but had been having more pasta (not whole grain), and also some white rice. Large portions of pasta. He wasn't eating out often.  Infrequent fast food, 2x/month ([employer] chicken sandwich and fries).  He was encouraged to eat more whole grains, cut back on portions, eat more vegetables.  Today he reports  Component Ref Range & Units 2 d ago (06/18/22) 3 mo ago (03/12/22) 6 mo ago (12/07/21) 1 yr ago (12/05/20) 2 yr ago (11/30/19) 3 yr ago (05/27/19) 3 yr ago (11/24/18)  Cholesterol, Total 100 - 199 mg/dL 142 156 148 129 124 123 148  Triglycerides 0 - 149 mg/dL 181 High  232 High  220 High  155 High  115 177 High  202 High   HDL >39 mg/dL 32 Low  33 Low  32 Low  31 Low  28 Low  27 Low  32 Low   VLDL Cholesterol Cal 5 - 40 mg/dL 31 39 37 27 21 30  40  LDL Chol Calc (NIH) 0 - 99 mg/dL  79 84 79 71 75 66   Chol/HDL Ratio 0.0 - 5.0 ratio 4.4 4.7 CM 4.6 CM 4.2 CM 4.4 CM 4.6 CM 4.6 CM   Elevated LFT's:  Component Ref Range & Units 2 d ago (06/18/22) 6 mo ago (12/07/21) 1 yr ago (12/05/20) 2 yr ago (11/30/19) 3 yr ago (05/27/19) 3 yr ago (11/24/18) 4 yr ago (05/28/18)  Total Protein 6.0 - 8.5 g/dL 7.2 7.4 7.0 7.1 6.8 7.1 7.2  Albumin 3.8 - 4.9 g/dL 4.9 4.7 4.5 R 4.7 R 4.4 R 4.7 R 4.5 R, CM  Bilirubin Total 0.0 - 1.2 mg/dL 0.6 0.6 0.9 0.6 0.7 0.6 0.3  Bilirubin, Direct 0.00 - 0.40 mg/dL 0.15    0.17    Alkaline Phosphatase 44 - 121 IU/L 127 High  115 100 115 R 128 High  R 121 High  R 156 High  R  AST 0 - 40 IU/L 28 22 23 19 23 28  34  ALT 0 - 44 IU/L 83 High  55 High  51 High  43 66 High  63 High  85 High     Pre-diabetes:  Fasting sugars have been elevated over the last few years, but A1c had been okay until recently. At his last visit he was encouraged to eat more whole grains, and smaller portions. He was  encouraged to eat more vegetables, fiber.  Component Ref Range & Units 2 d ago (06/18/22) 3 mo ago (03/12/22) 6 mo ago (12/07/21) 1 yr ago (12/05/20) 2 yr ago (11/30/19) 3 yr ago (05/27/19) 3 yr ago (11/24/18)  Glucose 70 - 99 mg/dL 129 High  120 High  124 High  116 High  R 109 High  R 104 High  R 109 High  R   Component Ref Range & Units 2 d ago (06/18/22) 6 mo ago (12/07/21) 1 yr ago (12/05/20) 2 yr ago (11/30/19) 3 yr ago (11/24/18) 4 yr ago (05/28/18) 4 yr ago (11/20/17)  Hgb A1c MFr Bld 4.8 - 5.6 % 5.9 High  5.7 High  CM 5.6 CM 5.5 CM 5.6 CM 5.7 High  CM 5.4 CM    Overweight--in October we strongly encouraged [employer], will help with accountability, encourage exercise, and more vegetables (no points).  Exercise: Lifting 65# foldable motorized chair for his wife.  No regular cardio. Not playing outside with the dog as much.   Observations/Objective:  There were no vitals taken for this visit.   Fasting glucose 129  Lab Results  Component Value Date   HGBA1C 5.9 (H)  06/18/2022    Lab Results  Component Value Date   CHOL 142 06/18/2022   HDL 32 (L) 06/18/2022   LDLCALC 79 06/18/2022   TRIG 181 (H) 06/18/2022   CHOLHDL 4.4 06/18/2022    Lab Results  Component Value Date   ALT 83 (H) 06/18/2022   AST 28 06/18/2022   ALKPHOS 127 (H) 06/18/2022   BILITOT 0.6 06/18/2022    Assessment and Plan:   Follow Up Instructions:    I discussed the assessment and treatment plan with the patient. The patient was provided an opportunity to ask questions and all were answered. The patient agreed with the plan and demonstrated an understanding of the instructions.   The patient was advised to call back or seek an in-person evaluation if the symptoms worsen or if the condition fails to improve as anticipated.  I spent *** minutes dedicated to the care of this patient, including pre-visit review of records, face to face time, post-visit ordering of testing and documentation.    Vikki Ports, MD

## 2022-06-21 ENCOUNTER — Telehealth: Payer: Managed Care, Other (non HMO) | Admitting: Family Medicine

## 2022-06-21 ENCOUNTER — Encounter: Payer: Self-pay | Admitting: Family Medicine

## 2022-06-21 VITALS — BP 130/80 | HR 72 | Ht 73.0 in | Wt 216.0 lb

## 2022-06-21 DIAGNOSIS — R7989 Other specified abnormal findings of blood chemistry: Secondary | ICD-10-CM | POA: Diagnosis not present

## 2022-06-21 DIAGNOSIS — E782 Mixed hyperlipidemia: Secondary | ICD-10-CM | POA: Diagnosis not present

## 2022-06-21 DIAGNOSIS — R7301 Impaired fasting glucose: Secondary | ICD-10-CM | POA: Diagnosis not present

## 2022-06-21 DIAGNOSIS — Z125 Encounter for screening for malignant neoplasm of prostate: Secondary | ICD-10-CM

## 2022-06-21 DIAGNOSIS — Z0000 Encounter for general adult medical examination without abnormal findings: Secondary | ICD-10-CM

## 2022-07-13 ENCOUNTER — Ambulatory Visit
Admission: RE | Admit: 2022-07-13 | Discharge: 2022-07-13 | Disposition: A | Discharge status: Home / Self Care | Payer: Managed Care, Other (non HMO) | Source: Ambulatory Visit | Attending: Family Medicine | Admitting: Family Medicine

## 2022-07-13 DIAGNOSIS — R7989 Other specified abnormal findings of blood chemistry: Secondary | ICD-10-CM

## 2022-11-27 ENCOUNTER — Telehealth: Payer: Self-pay | Admitting: Family Medicine

## 2022-11-27 NOTE — Telephone Encounter (Signed)
Yes  He already has orders in the system, for labs to be done prior to his physical

## 2022-11-27 NOTE — Telephone Encounter (Signed)
Pt would like to come in a week within his physical for blood work. Is this ok?

## 2022-12-24 ENCOUNTER — Other Ambulatory Visit: Payer: Managed Care, Other (non HMO)

## 2022-12-24 DIAGNOSIS — R7301 Impaired fasting glucose: Secondary | ICD-10-CM

## 2022-12-24 DIAGNOSIS — E782 Mixed hyperlipidemia: Secondary | ICD-10-CM

## 2022-12-24 DIAGNOSIS — R7989 Other specified abnormal findings of blood chemistry: Secondary | ICD-10-CM

## 2022-12-24 DIAGNOSIS — Z0000 Encounter for general adult medical examination without abnormal findings: Secondary | ICD-10-CM

## 2022-12-24 DIAGNOSIS — Z125 Encounter for screening for malignant neoplasm of prostate: Secondary | ICD-10-CM

## 2022-12-25 DIAGNOSIS — K760 Fatty (change of) liver, not elsewhere classified: Secondary | ICD-10-CM | POA: Insufficient documentation

## 2022-12-25 NOTE — Patient Instructions (Incomplete)
  HEALTH MAINTENANCE RECOMMENDATIONS:  It is recommended that you get at least 30 minutes of aerobic exercise at least 5 days/week (for weight loss, you may need as much as 60-90 minutes). This can be any activity that gets your heart rate up. This can be divided in 10-15 minute intervals if needed, but try and build up your endurance at least once a week.  Weight bearing exercise is also recommended twice weekly.  Eat a healthy diet with lots of vegetables, fruits and fiber.  "Colorful" foods have a lot of vitamins (ie green vegetables, tomatoes, red peppers, etc).  Limit sweet tea, regular sodas and alcoholic beverages, all of which has a lot of calories and sugar.  Up to 2 alcoholic drinks daily may be beneficial for men (unless trying to lose weight, watch sugars).  Drink a lot of water.  Sunscreen of at least SPF 30 should be used on all sun-exposed parts of the skin when outside between the hours of 10 am and 4 pm (not just when at beach or pool, but even with exercise, golf, tennis, and yard work!)  Use a sunscreen that says "broad spectrum" so it covers both UVA and UVB rays, and make sure to reapply every 1-2 hours.  Remember to change the batteries in your smoke detectors when changing your clock times in the spring and fall.  Carbon monoxide detectors are recommended for your home.  Use your seat belt every time you are in a car, and please drive safely and not be distracted with cell phones and texting while driving.   Yearly flu shots are recommended in the Fall. Updated COVID booster is recommended, when available in the Fall.  Try and work at cutting out processed meats and foods, and eating more chicken/fish and plant-based proteins, rather than red meat (beef, lamb and pork).

## 2022-12-25 NOTE — Progress Notes (Unsigned)
No chief complaint on file.   John Frazier is a 52 y.o. male who presents for a complete physical.  He had labs done prior to today's visit, see below.  Mixed hyperlipidemia:   He reports compliance with atorvastatin, and has been taking 2000 mg BID of fish oil. UPDATE IF MISSING DOSES *** He continues to limit carbs, just one type per meal.  Still having regular (not whole grain) pasta, white rice.  He has been trying to add more vegetables to the meals to eat less of the rice pasta. Doesn't like whole grain pasta. Not eating out often, infrequent fast food ([employer] chicken sandwich and fries).  UPDATE   Component Ref Range & Units 1 d ago (12/24/22) 6 mo ago (06/18/22) 9 mo ago (03/12/22) 1 yr ago (12/07/21) 2 yr ago (12/05/20) 3 yr ago (11/30/19) 3 yr ago (05/27/19)  Cholesterol, Total 100 - 199 mg/dL 355 732 202 542 706 237 123  Triglycerides 0 - 149 mg/dL 628 High  315 High  176 High  220 High  155 High  115 177 High   HDL >39 mg/dL 31 Low  32 Low  33 Low  32 Low  31 Low  28 Low  27 Low   VLDL Cholesterol Cal 5 - 40 mg/dL 30 31 39 37 27 21 30   LDL Chol Calc (NIH) 0 - 99 mg/dL 82 79 84 79 71 75 66  Chol/HDL Ratio 0.0 - 5.0 ratio 4.6 4.4 CM 4.7 CM 4.6 CM 4.2 CM 4.4 CM 4.6 CM    Pre-diabetes:  Fasting sugars have been elevated over the last few years, but A1c had been okay until recently. At his last visit he was encouraged to eat more whole grains, and smaller portions. He was encouraged to eat more vegetables, fiber. Slight changes made, as reported above.  Component Ref Range & Units 1 d ago (12/24/22) 6 mo ago (06/18/22) 1 yr ago (12/07/21) 2 yr ago (12/05/20) 3 yr ago (11/30/19) 4 yr ago (11/24/18) 4 yr ago (05/28/18)  Hgb A1c MFr Bld 4.8 - 5.6 % 5.9 High  5.9 High  CM 5.7 High  CM 5.6 CM 5.5 CM 5.6 CM 5.7 High  CM    He has had mild elevations in ALT and alk phos.  CT done 09/2017 (for trauma) didn't comment on any liver/hepatobiliary abnormality. [place] was done  07/2022: IMPRESSION: 1. No acute abnormality identified. 2. Diffuse increased echotexture of the liver, nonspecific but can be seen in fatty infiltration of liver.   Vitamin D deficiency:  Treated in past with rx. Currently taking 2000 IU daily. Last level was normal in July 2018 at 37.    Immunization History  Administered Date(s) Administered   Janssen (J&J) SARS-COV-2 Vaccination 03/22/2020   Tdap 01/08/2013   Zoster Recombinant(Shingrix) 03/23/2022, 06/18/2022   He had J&J vaccine, no boosters. Declines flu shots, COVID boosters Last colonoscopy: never. He was referred to Dr. Leone Payor twice, never scheduled. Negative Cologuard 12/2021. Last PSA: with recent labs Lab Results  Component Value Date   PSA1 0.4 12/24/2022   PSA1 0.4 12/07/2021   PSA1 0.4 12/05/2020   Dentist: regularly, 2x/year  Ophtho: yearly  Exercise:   No regular exercise. Has a pool (swim spa), but since he went back to work hasn't been doing this regularly.   PMH, PSH, SH and FH were reviewed and updated    ROS:  The patient denies anorexia, fever, headaches,  vision loss, decreased hearing, ear pain, hoarseness, chest  pain, palpitations, dizziness, syncope, dyspnea on exertion, cough, swelling, nausea, vomiting, diarrhea, abdominal pain, melena, indigestion/heartburn, hematuria, incontinence, erectile dysfunction, nocturia, weakened urine stream, dysuria, genital lesions, joint pains, numbness, tingling, weakness, tremor, suspicious skin lesions, depression, anxiety, abnormal bleeding/bruising, or enlarged lymph nodes. Weight    PHYSICAL EXAM:  There were no vitals taken for this visit. 134/98 on repeat by MD  Wt Readings from Last 3 Encounters:  06/21/22 216 lb (98 kg)  03/15/22 214 lb (97.1 kg)  12/11/21 214 lb 6.4 oz (97.3 kg)   General Appearance:     Alert, cooperative, no distress, appears stated age   Head:     Normocephalic, without obvious abnormality, atraumatic   Eyes:     PERRL,  conjunctiva/corneas clear, EOM's intact, fundi benign   Ears:     Normal TM's and external ear canals   Nose:    Normal, no drainage or sinus tenderness  Throat:    Normal, no lesions  Neck:    Supple, no lymphadenopathy;  thyroid:  no enlargement/ tenderness/nodules; no carotid bruit or JVD   Back:     Spine nontender, no curvature, ROM normal, no CVA tenderness   Lungs:      Clear to auscultation bilaterally without wheezes, rales or ronchi; respirations unlabored   Chest Wall:     No tenderness or deformity    Heart:     Regular rate and rhythm, S1 and S2 normal, no murmur, rub or gallop   Breast Exam:     No chest wall tenderness, masses or gynecomastia   Abdomen:      Soft, non-tender, nondistended, normoactive bowel sounds, no masses, no hepatosplenomegaly   Genitalia:     Normal male external genitalia without lesions.  Testicles without masses.  No inguinal hernias.   Rectal:     Exam declined by patient today.  Extremities:    No clubbing, cyanosis or edema. Bony deformity at R shoulder from prior fracture injury  Pulses:    2+ and symmetric all extremities   Skin:    Skin color, texture, turgor normal, no lesions.  Benign moles noted on chest, back.  No changes.  Lymph nodes:    Cervical, supraclavicular, and inguinal nodes normal   Neurologic:   Normal strength, sensation and gait; reflexes 2+ and symmetric throughout                    Psych:   Normal mood, affect, hygiene and grooming  ***RECTAL EXAM UPDATE  Lab Results  Component Value Date   CHOL 143 12/24/2022   HDL 31 (L) 12/24/2022   LDLCALC 82 12/24/2022   TRIG 176 (H) 12/24/2022   CHOLHDL 4.6 12/24/2022      Chemistry      Component Value Date/Time   NA 139 12/24/2022 0821   K 4.2 12/24/2022 0821   CL 103 12/24/2022 0821   CO2 23 12/24/2022 0821   BUN 10 12/24/2022 0821   CREATININE 0.99 12/24/2022 0821   CREATININE 0.96 11/19/2016 1242      Component Value Date/Time   CALCIUM 9.4 12/24/2022 0821    ALKPHOS 109 12/24/2022 0821   AST 27 12/24/2022 0821   ALT 68 (H) 12/24/2022 0821   BILITOT 0.8 12/24/2022 0821     Fasting glu 126 GGT 47 (normal) PSA 0.4  Lab Results  Component Value Date   HGBA1C 5.9 (H) 12/24/2022   Lab Results  Component Value Date   WBC 6.0 12/24/2022  HGB 14.9 12/24/2022   HCT 43.7 12/24/2022   MCV 90 12/24/2022   PLT 205 12/24/2022    ASSESSMENT/PLAN:  TdaP today  Recommended at least 30 minutes of aerobic activity at least 5 days/week, weight-bearing exercise at least 2x/wk; proper sunscreen use reviewed; healthy diet and alcohol recommendations (less than or equal to 2 drinks/day) reviewed; regular seatbelt use; changing batteries in smoke detectors.   Immunization recommendations discussed--yearly flu shots recommended, COVID booster recommended--can wait until new one comes out in the Fall (patient declines). TdaP  Colon cancer screening UTD, Cologuard due again 12/2024.

## 2022-12-26 ENCOUNTER — Encounter: Payer: Self-pay | Admitting: Family Medicine

## 2022-12-26 ENCOUNTER — Ambulatory Visit (INDEPENDENT_AMBULATORY_CARE_PROVIDER_SITE_OTHER): Payer: Managed Care, Other (non HMO) | Admitting: Family Medicine

## 2022-12-26 VITALS — BP 120/74 | HR 75 | Ht 73.5 in | Wt 212.0 lb

## 2022-12-26 DIAGNOSIS — R7989 Other specified abnormal findings of blood chemistry: Secondary | ICD-10-CM | POA: Diagnosis not present

## 2022-12-26 DIAGNOSIS — Z23 Encounter for immunization: Secondary | ICD-10-CM

## 2022-12-26 DIAGNOSIS — K760 Fatty (change of) liver, not elsewhere classified: Secondary | ICD-10-CM

## 2022-12-26 DIAGNOSIS — E782 Mixed hyperlipidemia: Secondary | ICD-10-CM

## 2022-12-26 DIAGNOSIS — Z125 Encounter for screening for malignant neoplasm of prostate: Secondary | ICD-10-CM

## 2022-12-26 DIAGNOSIS — Z0000 Encounter for general adult medical examination without abnormal findings: Secondary | ICD-10-CM

## 2022-12-26 DIAGNOSIS — R7301 Impaired fasting glucose: Secondary | ICD-10-CM

## 2022-12-26 DIAGNOSIS — Z5181 Encounter for therapeutic drug level monitoring: Secondary | ICD-10-CM

## 2022-12-26 LAB — POCT URINALYSIS DIP (PROADVANTAGE DEVICE)
Blood, UA: NEGATIVE
Glucose, UA: NEGATIVE mg/dL
Ketones, POC UA: NEGATIVE mg/dL
Leukocytes, UA: NEGATIVE
Nitrite, UA: NEGATIVE
Protein Ur, POC: NEGATIVE mg/dL
Specific Gravity, Urine: 1.025
Urobilinogen, Ur: NEGATIVE
pH, UA: 6 (ref 5.0–8.0)

## 2022-12-26 MED ORDER — ATORVASTATIN CALCIUM 20 MG PO TABS: 20.0000 mg | ORAL_TABLET | Freq: Every day | ORAL | 3 refills | Status: DC

## 2022-12-26 NOTE — Assessment & Plan Note (Signed)
Fatty infiltration of the liver noted on [place] in 07/2022.  Discussed weight loss, limiting fried/fatty foods.  We discussed alcohol on upcoming vacation--may have an occasional glass of wine in [place] (only wants to try some when in the restaurants in [place] and [place], not to drink any alcohol on the cruise), to limit to one glass, and to limit sugary beverages in general, on the trip.  Discussed impact on liver--alcohol, elevated triglycerides, and being overweight.

## 2022-12-26 NOTE — Assessment & Plan Note (Signed)
Reviewed diet in detail.  Encouraged high fiber diet, whole grains, limiting sugar and carbs in diet.  He drinks a lot of "zero" beverages, discussed artificial sweeteners and healthier choices. Encouraged daily exercise to help with insulin resistance/blood sugars, as well as encouraged weight loss from the waist/abdomen

## 2022-12-26 NOTE — Assessment & Plan Note (Signed)
TG elevated at 176, HDL low at 31.  LDL good at 82. Reviewed lowfat, low cholesterol diet in detail. Continue current regimen of atorvastatin and 2000mg  BID fish oil. To cut back on onion rings, burgers, try and eat more vegetables.

## 2023-04-14 NOTE — Progress Notes (Unsigned)
Start time: End time:  Virtual Visit via Video Note  I connected with Lanier Prude on 04/14/23 by a video enabled telemedicine application and verified that I am speaking with the correct person using two identifiers.  Location: Patient: *** Provider: office   I discussed the limitations of evaluation and management by telemedicine and the availability of in person appointments. The patient expressed understanding and agreed to proceed.  History of Present Illness:  No chief complaint on file.    PMH, PSH, SH reviewed    ROS:    Observations/Objective:  There were no vitals taken for this visit.   Assessment and Plan:   Follow Up Instructions:    I discussed the assessment and treatment plan with the patient. The patient was provided an opportunity to ask questions and all were answered. The patient agreed with the plan and demonstrated an understanding of the instructions.   The patient was advised to call back or seek an in-person evaluation if the symptoms worsen or if the condition fails to improve as anticipated.  I spent *** minutes dedicated to the care of this patient, including pre-visit review of records, face to face time, post-visit ordering of testing and documentation.    Lavonda Jumbo, MD

## 2023-04-15 ENCOUNTER — Telehealth: Payer: Managed Care, Other (non HMO) | Admitting: Family Medicine

## 2023-04-15 ENCOUNTER — Encounter: Payer: Self-pay | Admitting: Family Medicine

## 2023-04-15 VITALS — BP 119/84 | Ht 73.5 in | Wt 212.0 lb

## 2023-04-15 DIAGNOSIS — N529 Male erectile dysfunction, unspecified: Secondary | ICD-10-CM

## 2023-04-15 MED ORDER — SILDENAFIL CITRATE 100 MG PO TABS: 50.0000 mg | ORAL_TABLET | Freq: Every day | ORAL | 6 refills | Status: AC | PRN

## 2023-06-27 ENCOUNTER — Other Ambulatory Visit: Payer: Managed Care, Other (non HMO)

## 2023-06-27 DIAGNOSIS — R7301 Impaired fasting glucose: Secondary | ICD-10-CM

## 2023-06-27 DIAGNOSIS — R7989 Other specified abnormal findings of blood chemistry: Secondary | ICD-10-CM

## 2023-06-27 DIAGNOSIS — E782 Mixed hyperlipidemia: Secondary | ICD-10-CM

## 2023-06-27 DIAGNOSIS — K760 Fatty (change of) liver, not elsewhere classified: Secondary | ICD-10-CM

## 2023-06-27 DIAGNOSIS — Z5181 Encounter for therapeutic drug level monitoring: Secondary | ICD-10-CM

## 2023-06-27 LAB — LIPID PANEL

## 2023-06-28 LAB — GLUCOSE, RANDOM: Glucose: 115 mg/dL — ABNORMAL HIGH (ref 70–99)

## 2023-06-28 LAB — HEMOGLOBIN A1C
Est. average glucose Bld gHb Est-mCnc: 123 mg/dL
Hgb A1c MFr Bld: 5.9 % — ABNORMAL HIGH (ref 4.8–5.6)

## 2023-06-28 LAB — LIPID PANEL
Cholesterol, Total: 125 mg/dL (ref 100–199)
HDL: 29 mg/dL — ABNORMAL LOW (ref >39)
LDL CALC COMMENT:: 4.3 ratio (ref 0.0–5.0)
LDL Chol Calc (NIH): 70 mg/dL (ref 0–99)
Triglycerides: 150 mg/dL — ABNORMAL HIGH (ref 0–149)
VLDL Cholesterol Cal: 26 mg/dL (ref 5–40)

## 2023-06-28 LAB — HEPATIC FUNCTION PANEL
ALT: 54 IU/L — ABNORMAL HIGH (ref 0–44)
AST: 22 IU/L (ref 0–40)
Albumin: 4.4 g/dL (ref 3.8–4.9)
Alkaline Phosphatase: 116 IU/L (ref 44–121)
Bilirubin Total: 0.5 mg/dL (ref 0.0–1.2)
Bilirubin, Direct: 0.16 mg/dL (ref 0.00–0.40)
Total Protein: 6.8 g/dL (ref 6.0–8.5)

## 2023-06-30 DIAGNOSIS — R7989 Other specified abnormal findings of blood chemistry: Secondary | ICD-10-CM | POA: Insufficient documentation

## 2023-06-30 NOTE — Progress Notes (Unsigned)
No chief complaint on file.  Patient presents for 6 month f/u on chronic problems. He had labs done prior to visit, see below.  He was seen in December with intermittent ED. He was given rx for sildenafil for prn use.   Mixed hyperlipidemia:  He reports compliance with atorvastatin, and has been taking 2000 mg BID of fish oil. He is doing a meal delivery program 4-5 days/week, got the low sodium version.  He can't recall the name/brand. Portions are small, but they will augment with extra vegetables to make the meals heartier. Eats out once a week (day of grocery shopping), not fast food. Sometimes sandwich or burger, onion rings. Fast food is much less frequent ([employer] mushroom and swiss burger, no fries).   Lab Results  Component Value Date   CHOL 125 06/27/2023   HDL 29 (L) 06/27/2023   LDLCALC 70 06/27/2023   TRIG 150 (H) 06/27/2023   CHOLHDL 4.3 06/27/2023   Prior lipids in August 2024 had elevated TG 176, HDL 31, LDL 82, chol/HDL ratio 4.6    Pre-diabetes:  Fasting sugars have been elevated over the last few years, but A1c had been normal until 11/2021. A1c remains 5.9% over the last year.  Doesn't drink much juice (only some gatorade zero).   Doesn't drink any sugared beverages, drinks "zero" sodas.  Cut back on portions (using meal delivery), eating more vegetables. Whole grains? ***  Fasting glucose on recent labs was 115. Lab Results  Component Value Date   HGBA1C 5.9 (H) 06/27/2023       He has had mild elevations in ALT and alk phos.  CT done 09/2017 (for trauma) didn't comment on any liver/hepatobiliary abnormality. [place] was done 07/2022: IMPRESSION: 1. No acute abnormality identified. 2. Diffuse increased echotexture of the liver, nonspecific but can be seen in fatty infiltration of liver.   Lab Results  Component Value Date   ALT 54 (H) 06/27/2023   AST 22 06/27/2023   GGT 47 12/24/2022   ALKPHOS 116 06/27/2023   BILITOT 0.5 06/27/2023   ALT was mildly  elevated on recent labs, but down from 68 and 83 on prior checks in 2024.    PMH, PSH, SH reviewed   ROS:  No fever, chills, URI symptoms, headaches, dizziness, chest pain, shortness of breath, edema. Denies GI complaints.  ***?gassiness??  Moods are good.  Weight    PHYSICAL EXAM:  There were no vitals taken for this visit.  Wt Readings from Last 3 Encounters:  04/15/23 212 lb (96.2 kg)  12/26/22 212 lb (96.2 kg)  06/21/22 216 lb (98 kg)   Pleasant, well-appearing male in good spirits, in no distress HEENT: EOMI, conjunctiva and sclera are clear. OP clear, no sinus tenderness Neck: no lymphadenopathy, thyromegaly or mass Heart: regular rate and rhythm Lungs: clear bilaterally Abdomen: soft, nontender, no organomegaly or mass Extremities: no edema, normal pulses Psych: normal mood, affect, hygiene and grooming Neuro: alert and oriented, cranial nerves intact, normal gait    ASSESSMENT/PLAN:  Document, give or decline flu shot. Please do whatever you need to do (?abstract?) the Cologuard so that it shows (12/2021), and no colonoscopy gap. Thanks.  If he wants fasting labs done prior to his August physical, please enter future orders.   He is 8:45 CPE, so if he prefers to come fasting to visit, that's fine too.  Will be due for C-met, lipids, A1c, CBC, PSA

## 2023-07-01 ENCOUNTER — Ambulatory Visit (INDEPENDENT_AMBULATORY_CARE_PROVIDER_SITE_OTHER): Payer: Managed Care, Other (non HMO) | Admitting: Family Medicine

## 2023-07-01 ENCOUNTER — Encounter: Payer: Self-pay | Admitting: Family Medicine

## 2023-07-01 ENCOUNTER — Other Ambulatory Visit: Payer: Self-pay | Admitting: *Deleted

## 2023-07-01 ENCOUNTER — Encounter: Payer: Self-pay | Admitting: *Deleted

## 2023-07-01 VITALS — BP 132/86 | HR 84 | Ht 73.5 in | Wt 215.4 lb

## 2023-07-01 DIAGNOSIS — Z0000 Encounter for general adult medical examination without abnormal findings: Secondary | ICD-10-CM

## 2023-07-01 DIAGNOSIS — K760 Fatty (change of) liver, not elsewhere classified: Secondary | ICD-10-CM | POA: Diagnosis not present

## 2023-07-01 DIAGNOSIS — E782 Mixed hyperlipidemia: Secondary | ICD-10-CM

## 2023-07-01 DIAGNOSIS — R7301 Impaired fasting glucose: Secondary | ICD-10-CM

## 2023-07-01 DIAGNOSIS — Z125 Encounter for screening for malignant neoplasm of prostate: Secondary | ICD-10-CM

## 2023-07-01 DIAGNOSIS — R7989 Other specified abnormal findings of blood chemistry: Secondary | ICD-10-CM

## 2023-07-01 DIAGNOSIS — R030 Elevated blood-pressure reading, without diagnosis of hypertension: Secondary | ICD-10-CM

## 2023-07-01 NOTE — Patient Instructions (Addendum)
Please try and get some regular exercise (even if not able to hit the goal of 150 minutes/week--anything is better than nothing).  Exercise helps keep blood pressure down and blood sugars down.    Try and limit the sodium in your diet (read labels, use low sodium broths, rinse any canned foods, limit chips/crackers that are salted). Goal blood pressure is 130/80 or less.  Flu shots are recommended--ideally to get in the Fall (Sept/October) for the fullest benefit. There is still value to getting it now (you can get from the pharmacy if you change your mind).

## 2023-12-24 ENCOUNTER — Other Ambulatory Visit: Payer: Managed Care, Other (non HMO)

## 2023-12-24 NOTE — Progress Notes (Signed)
 Chief Complaint  Patient presents with   Annual Exam    Pt decline prevnar 20   John Frazier is a 53 y.o. male who presents for a complete physical.  He had labs done prior to today's visit, see below.  Intermittent ED. He was given rx for sildenafil  for prn use in 04/2023.  He finds 50 mg dose is effective, using prn.   Mixed hyperlipidemia:  He reports compliance with atorvastatin , and has been taking 2 pills BID of fish oil  (admits to missing morning dose sometimes on the weekends).  He is doing a meal delivery program 4-5 days/week, got the low sodium version.  Portions are small, but they will augment with extra vegetables to make the meals heartier. Eats out 1-2x/week. Doesn't eat fried foods often.  Usually will have [place] or [place] food, rather than fast food. +tortilla chips when getting [place], but has also had it in the house for snacking the last couple of weeks.  Lipids in 06/2023 were good, with LDL 70, TG 150, low HDL, 29. Labs prior to visit showed higher TG, 195.   Component Ref Range & Units (hover) 1 d ago (12/25/23) 6 mo ago (06/27/23) 1 yr ago (12/24/22) 1 yr ago (06/18/22) 1 yr ago (03/12/22) 2 yr ago (12/07/21) 3 yr ago (12/05/20)  Cholesterol, Total 136 125 143 142 156 148 129  Triglycerides 195 High  150 High  176 High  181 High  232 High  220 High  155 High   HDL 30 Low  29 Low  31 Low  32 Low  33 Low  32 Low  31 Low   VLDL Cholesterol Cal 33 26 30 31  39 37 27  LDL Chol Calc (NIH) 73 70 82 79 84 79 71  Chol/HDL Ratio 4.5 4.3 CM 4.6 CM 4.4 CM 4.7 CM 4.6 CM 4.2 C   Pre-diabetes:  Fasting sugars have been elevated over the last few years, but A1c had been normal until 11/2021. A1c remains was 5.8% on recent labs, with fasting sugar of 114. Has sugar-free beverages (made from powder). Infrequent soda, always the zero kind. Drinks a lot of water, also 1% milk, sometimes chocolate milk (also 1%). Cut back on portions (using meal delivery), eating more  vegetables. Has some flatbreads, tortillas. Has rice with [place] food (gets combo meal, with fried rice)--hasn't had [place] food as often. Doesn't buy sweets or snacks usually, but has had tortilla chips recently.  Doesn't have desserts.   He hasn't been exercising much in the last year.   He has had mild elevations in ALT and alk phos.  CT done 09/2017 (for trauma) didn't comment on any liver/hepatobiliary abnormality. US  was done 07/2022: IMPRESSION: 1. No acute abnormality identified. 2. Diffuse increased echotexture of the liver, nonspecific but can be seen in fatty infiltration of liver.   Vitamin D  deficiency:  Treated in past with rx. Levels were low in 2017 (20, 25).  Last check was 11/2016, normal level of 37.  Currently taking 2000 IU daily.   Immunization History  Administered Date(s) Administered   Janssen (J&J) SARS-COV-2 Vaccination 03/22/2020   Tdap 01/08/2013, 12/26/2022   Zoster Recombinant(Shingrix ) 03/23/2022, 06/18/2022   He had J&J vaccine, no boosters. Declines flu shots, COVID boosters Last colonoscopy: never. He was referred to Dr. Avram twice, never scheduled. Negative Cologuard 12/2021. Last PSA: with recent labs Lab Results  Component Value Date   PSA1 0.4 12/25/2023   PSA1 0.4 12/24/2022   PSA1 0.4 12/07/2021  Dentist: regularly, 2x/year  Ophtho: yearly  Exercise:  Hasn't been swimming much due to the rain, hasn't been mowing yard. Still plays with the dogs, less active (now has a dog that brings the ball back!)   PMH, PSH, SH and FH were reviewed and updated  Outpatient Encounter Medications as of 12/26/2023  Medication Sig Note   Cholecalciferol (VITAMIN D ) 2000 units tablet Take 2,000 Units by mouth daily.    Omega-3 Fatty Acids (FISH OIL ) 1200 MG CPDR Take 2 capsules by mouth in the morning and at bedtime. 07/01/2023: Might miss am dose occasionally   sildenafil  (VIAGRA ) 100 MG tablet Take 0.5-1 tablets (50-100 mg total) by mouth daily as  needed for erectile dysfunction. 07/01/2023: Using 1/2 tablet as needed   atorvastatin  (LIPITOR) 20 MG tablet Take 1 tablet (20 mg total) by mouth daily.    [DISCONTINUED] atorvastatin  (LIPITOR) 20 MG tablet Take 1 tablet (20 mg total) by mouth daily.    No facility-administered encounter medications on file as of 12/26/2023.   No Known Allergies  ROS:  The patient denies anorexia, fever, headaches,  vision loss, decreased hearing, ear pain, hoarseness, chest pain, palpitations, dizziness, syncope, dyspnea on exertion, cough, swelling, nausea, vomiting, diarrhea, abdominal pain, melena, indigestion/heartburn, hematuria, incontinence, erectile dysfunction, nocturia, weakened urine stream, dysuria, genital lesions, numbness, tingling, weakness, tremor, suspicious skin lesions, depression, anxiety, abnormal bleeding/bruising, or enlarged lymph nodes.  Intermittent ED, meds are effective Occ R shoulder pain (sold his motorcycle due to the discomfort when riding it).  Might bother him if using his mouse a lot (for spreadsheets), or throwing the ball for the dogs (has to throw underhand).   PHYSICAL EXAM:  BP 130/70 (BP Location: Right Arm, Patient Position: Sitting, Cuff Size: Large)   Pulse 84   Ht 6' 1 (1.854 m)   Wt 216 lb 9.6 oz (98.2 kg)   SpO2 97%   BMI 28.58 kg/m    Wt Readings from Last 3 Encounters:  12/26/23 216 lb 9.6 oz (98.2 kg)  07/01/23 215 lb 6.4 oz (97.7 kg)  04/15/23 212 lb (96.2 kg)   General Appearance:     Alert, cooperative, no distress, appears stated age   Head:     Normocephalic, without obvious abnormality, atraumatic   Eyes:     PERRL, conjunctiva/corneas clear, EOM's intact, fundi benign   Ears:     Normal external ear canals.  Left is obscured by cerumen, R is partly obscured.   Nose:    Normal, no drainage or sinus tenderness  Throat:    Normal, no lesions  Neck:    Supple, no lymphadenopathy;  thyroid:  no enlargement/ tenderness/nodules; no carotid bruit  or JVD   Back:     Spine nontender, no curvature, ROM normal, no CVA tenderness   Lungs:      Clear to auscultation bilaterally without wheezes, rales or ronchi; respirations unlabored   Chest Wall:     No tenderness or deformity    Heart:     Regular rate and rhythm, S1 and S2 normal, no murmur, rub or gallop   Breast Exam:     No chest wall tenderness, masses or gynecomastia   Abdomen:      Soft, non-tender, nondistended, normoactive bowel sounds, no masses, no hepatosplenomegaly   Genitalia:     Normal male external genitalia without lesions.  Testicles without masses.  No inguinal hernias.   Rectal:     Not performed (no prostate symptoms, normal PSA already done)  Extremities:    No clubbing, cyanosis or edema. Bony deformity at R shoulder from prior fracture injury  Pulses:    2+ and symmetric all extremities   Skin:    Skin color, texture, turgor normal, no lesions.  Benign moles noted on chest, back.  No changes.  Lymph nodes:    Cervical, supraclavicular, and inguinal nodes normal   Neurologic:   Normal strength, sensation and gait; reflexes 2+ and symmetric throughout                    Psych:   Normal mood, affect, hygiene and grooming   Lab Results  Component Value Date   CHOL 136 12/25/2023   HDL 30 (L) 12/25/2023   LDLCALC 73 12/25/2023   TRIG 195 (H) 12/25/2023   CHOLHDL 4.5 12/25/2023      Chemistry      Component Value Date/Time   NA 139 12/25/2023 0920   K 4.3 12/25/2023 0920   CL 102 12/25/2023 0920   CO2 20 12/25/2023 0920   BUN 15 12/25/2023 0920   CREATININE 1.12 12/25/2023 0920   CREATININE 0.96 11/19/2016 1242      Component Value Date/Time   CALCIUM  9.3 12/25/2023 0920   ALKPHOS 106 12/25/2023 0920   AST 25 12/25/2023 0920   ALT 64 (H) 12/25/2023 0920   BILITOT 0.6 12/25/2023 0920     Fasting glu 114  PSA 0.4  Lab Results  Component Value Date   HGBA1C 5.8 (H) 12/25/2023   Lab Results  Component Value Date   WBC 7.3 12/25/2023   HGB  14.9 12/25/2023   HCT 45.4 12/25/2023   MCV 93 12/25/2023   PLT 224 12/25/2023    Fibrosis 4 Score = .74 (Low risk)        Interpretation for patients with NAFLD          <1.30       -  F0-F1 (Low risk)          1.30-2.67 -  Indeterminate           >2.67      -  F3-F4 (High risk)     Validated for ages 69-65        ASSESSMENT/PLAN:  Annual physical exam  Mixed dyslipidemia - TG higher, some missed fish oil  doses and more chips. Reviewed lowfat diet, continue current medications. - Plan: atorvastatin  (LIPITOR) 20 MG tablet, Lipid panel  Impaired fasting glucose - reviewed diet, encouraged daily exercise, weight loss - Plan: Glucose, random, Hemoglobin A1c  NAFL (nonalcoholic fatty liver) - discussed implications/risks, importance of keeping weight down, controlling triglycerides - Plan: Hepatic function panel  Erectile dysfunction, unspecified erectile dysfunction type - cont Viagra  prn, 50 mg dose is effective  Vitamin D  deficiency - Low in 2017, last checked in 2018. Compliant with supplements. Desires recheck of level with next labs. - Plan: VITAMIN D  25 Hydroxy (Vit-D Deficiency, Fractures)  Medication monitoring encounter - Plan: Lipid panel, VITAMIN D  25 Hydroxy (Vit-D Deficiency, Fractures), Hepatic function panel  Recommended at least 30 minutes of aerobic activity at least 5 days/week, weight-bearing exercise at least 2x/wk; proper sunscreen use reviewed; healthy diet and alcohol recommendations (less than or equal to 2 drinks/day) reviewed; regular seatbelt use; changing batteries in smoke detectors.   Immunization recommendations discussed--yearly flu shots recommended (declines), COVID booster recommended when updated in the Fall (declines). Prevnar-20 and Capvaxive 21 discussed in detail, to consider in future ([place], f/u visit, or from pharmacy).  Colon cancer screening UTD, Cologuard due again 12/2024.  F/u 6 months for med check, with fasting labs prior--lft's, glu,  lipids, A1c Wants D also checked

## 2023-12-24 NOTE — Patient Instructions (Addendum)
  HEALTH MAINTENANCE RECOMMENDATIONS:  It is recommended that you get at least 30 minutes of aerobic exercise at least 5 days/week (for weight loss, you may need as much as 60-90 minutes). This can be any activity that gets your heart rate up. This can be divided in 10-15 minute intervals if needed, but try and build up your endurance at least once a week.  Weight bearing exercise is also recommended twice weekly.  Eat a healthy diet with lots of vegetables, fruits and fiber.  Colorful foods have a lot of vitamins (ie green vegetables, tomatoes, red peppers, etc).  Limit sweet tea, regular sodas and alcoholic beverages, all of which has a lot of calories and sugar.  Up to 2 alcoholic drinks daily may be beneficial for men (unless trying to lose weight, watch sugars).  Drink a lot of water.  Sunscreen of at least SPF 30 should be used on all sun-exposed parts of the skin when outside between the hours of 10 am and 4 pm (not just when at beach or pool, but even with exercise, golf, tennis, and yard work!)  Use a sunscreen that says broad spectrum so it covers both UVA and UVB rays, and make sure to reapply every 1-2 hours.  Remember to change the batteries in your smoke detectors when changing your clock times in the spring and fall.  Carbon monoxide detectors are recommended for your home.  Use your seat belt every time you are in a car, and please drive safely and not be distracted with cell phones and texting while driving.  Consider Carb Balance tortillas for the wraps, rather than regular tortillas. Try and use whole grain for carbs (bread, pasta).   We discussed the 2 pneumonia vaccines Prevnar-20 and Capvaxive-21.  You need only ONE of these, just once, to protect from pneumococcal illnesses. You can get Prevnar-20 from our office, or either of them from the pharmacy, at your convenience.

## 2023-12-25 ENCOUNTER — Other Ambulatory Visit

## 2023-12-25 DIAGNOSIS — R7989 Other specified abnormal findings of blood chemistry: Secondary | ICD-10-CM

## 2023-12-25 DIAGNOSIS — E782 Mixed hyperlipidemia: Secondary | ICD-10-CM

## 2023-12-25 DIAGNOSIS — R7301 Impaired fasting glucose: Secondary | ICD-10-CM

## 2023-12-25 DIAGNOSIS — Z125 Encounter for screening for malignant neoplasm of prostate: Secondary | ICD-10-CM

## 2023-12-25 DIAGNOSIS — Z0000 Encounter for general adult medical examination without abnormal findings: Secondary | ICD-10-CM

## 2023-12-26 ENCOUNTER — Ambulatory Visit: Payer: Managed Care, Other (non HMO) | Admitting: Family Medicine

## 2023-12-26 ENCOUNTER — Encounter: Payer: Self-pay | Admitting: Family Medicine

## 2023-12-26 VITALS — BP 130/70 | HR 84 | Ht 73.0 in | Wt 216.6 lb

## 2023-12-26 DIAGNOSIS — R7301 Impaired fasting glucose: Secondary | ICD-10-CM | POA: Diagnosis not present

## 2023-12-26 DIAGNOSIS — Z0000 Encounter for general adult medical examination without abnormal findings: Secondary | ICD-10-CM

## 2023-12-26 DIAGNOSIS — Z5181 Encounter for therapeutic drug level monitoring: Secondary | ICD-10-CM

## 2023-12-26 DIAGNOSIS — K760 Fatty (change of) liver, not elsewhere classified: Secondary | ICD-10-CM

## 2023-12-26 DIAGNOSIS — E782 Mixed hyperlipidemia: Secondary | ICD-10-CM

## 2023-12-26 DIAGNOSIS — N529 Male erectile dysfunction, unspecified: Secondary | ICD-10-CM | POA: Diagnosis not present

## 2023-12-26 DIAGNOSIS — E559 Vitamin D deficiency, unspecified: Secondary | ICD-10-CM

## 2023-12-26 LAB — CMP14+EGFR
ALT: 64 IU/L — ABNORMAL HIGH (ref 0–44)
AST: 25 IU/L (ref 0–40)
Albumin: 4.6 g/dL (ref 3.8–4.9)
Alkaline Phosphatase: 106 IU/L (ref 44–121)
BUN/Creatinine Ratio: 13 (ref 9–20)
BUN: 15 mg/dL (ref 6–24)
Bilirubin Total: 0.6 mg/dL (ref 0.0–1.2)
CO2: 20 mmol/L (ref 20–29)
Calcium: 9.3 mg/dL (ref 8.7–10.2)
Chloride: 102 mmol/L (ref 96–106)
Creatinine, Ser: 1.12 mg/dL (ref 0.76–1.27)
Globulin, Total: 2.4 g/dL (ref 1.5–4.5)
Glucose: 114 mg/dL — ABNORMAL HIGH (ref 70–99)
Potassium: 4.3 mmol/L (ref 3.5–5.2)
Sodium: 139 mmol/L (ref 134–144)
Total Protein: 7 g/dL (ref 6.0–8.5)
eGFR: 79 mL/min/1.73 (ref >59)

## 2023-12-26 LAB — CBC WITH DIFFERENTIAL/PLATELET
Basophils Absolute: 0 x10E3/uL (ref 0.0–0.2)
Basos: 1 %
EOS (ABSOLUTE): 0.2 x10E3/uL (ref 0.0–0.4)
Eos: 3 %
Hematocrit: 45.4 % (ref 37.5–51.0)
Hemoglobin: 14.9 g/dL (ref 13.0–17.7)
Immature Grans (Abs): 0 x10E3/uL (ref 0.0–0.1)
Immature Granulocytes: 0 %
Lymphocytes Absolute: 2.7 x10E3/uL (ref 0.7–3.1)
Lymphs: 37 %
MCH: 30.5 pg (ref 26.6–33.0)
MCHC: 32.8 g/dL (ref 31.5–35.7)
MCV: 93 fL (ref 79–97)
Monocytes Absolute: 0.5 x10E3/uL (ref 0.1–0.9)
Monocytes: 7 %
Neutrophils Absolute: 3.8 x10E3/uL (ref 1.4–7.0)
Neutrophils: 52 %
Platelets: 224 x10E3/uL (ref 150–450)
RBC: 4.88 x10E6/uL (ref 4.14–5.80)
RDW: 13.1 % (ref 11.6–15.4)
WBC: 7.3 x10E3/uL (ref 3.4–10.8)

## 2023-12-26 LAB — PSA: Prostate Specific Ag, Serum: 0.4 ng/mL (ref 0.0–4.0)

## 2023-12-26 LAB — HEMOGLOBIN A1C
Est. average glucose Bld gHb Est-mCnc: 120 mg/dL
Hgb A1c MFr Bld: 5.8 % — ABNORMAL HIGH (ref 4.8–5.6)

## 2023-12-26 LAB — LIPID PANEL
Chol/HDL Ratio: 4.5 ratio (ref 0.0–5.0)
Cholesterol, Total: 136 mg/dL (ref 100–199)
HDL: 30 mg/dL — ABNORMAL LOW (ref >39)
LDL Chol Calc (NIH): 73 mg/dL (ref 0–99)
Triglycerides: 195 mg/dL — ABNORMAL HIGH (ref 0–149)
VLDL Cholesterol Cal: 33 mg/dL (ref 5–40)

## 2023-12-26 MED ORDER — ATORVASTATIN CALCIUM 20 MG PO TABS: 20.0000 mg | ORAL_TABLET | Freq: Every day | ORAL | 3 refills | Status: AC

## 2024-06-29 ENCOUNTER — Other Ambulatory Visit: Payer: Self-pay

## 2024-07-01 ENCOUNTER — Encounter: Payer: Self-pay | Admitting: Family Medicine

## 2025-01-25 ENCOUNTER — Encounter: Payer: Self-pay | Admitting: Family Medicine
